# Patient Record
Sex: Female | Born: 1950 | Race: White | Hispanic: No | Marital: Married | Smoking: Never smoker
Health system: Southern US, Community
[De-identification: ages and names within clinical notes are randomized; demographics above are authoritative.]

## PROBLEM LIST (undated history)

## (undated) DIAGNOSIS — M1990 Unspecified osteoarthritis, unspecified site: Secondary | ICD-10-CM

## (undated) DIAGNOSIS — I10 Essential (primary) hypertension: Secondary | ICD-10-CM

## (undated) DIAGNOSIS — Z860100 Personal history of colon polyps, unspecified: Secondary | ICD-10-CM

## (undated) DIAGNOSIS — M810 Age-related osteoporosis without current pathological fracture: Secondary | ICD-10-CM

## (undated) DIAGNOSIS — K589 Irritable bowel syndrome without diarrhea: Secondary | ICD-10-CM

## (undated) HISTORY — PX: RHINOPLASTY: SUR1284

## (undated) HISTORY — PX: OOPHORECTOMY: SHX86

## (undated) HISTORY — DX: Personal history of colonic polyps: Z86.010

## (undated) HISTORY — DX: Essential (primary) hypertension: I10

## (undated) HISTORY — PX: VAGINAL HYSTERECTOMY: SHX2639

## (undated) HISTORY — PX: TOE SURGERY: SHX1073

## (undated) HISTORY — DX: Irritable bowel syndrome, unspecified: K58.9

## (undated) HISTORY — DX: Age-related osteoporosis without current pathological fracture: M81.0

## (undated) HISTORY — DX: Personal history of colon polyps, unspecified: Z86.0100

## (undated) HISTORY — DX: Unspecified osteoarthritis, unspecified site: M19.90

---

## 2015-10-30 DIAGNOSIS — M8580 Other specified disorders of bone density and structure, unspecified site: Secondary | ICD-10-CM | POA: Insufficient documentation

## 2015-10-30 DIAGNOSIS — E559 Vitamin D deficiency, unspecified: Secondary | ICD-10-CM | POA: Insufficient documentation

## 2015-10-30 DIAGNOSIS — K219 Gastro-esophageal reflux disease without esophagitis: Secondary | ICD-10-CM | POA: Insufficient documentation

## 2015-10-30 DIAGNOSIS — E039 Hypothyroidism, unspecified: Secondary | ICD-10-CM

## 2015-10-30 DIAGNOSIS — E7800 Pure hypercholesterolemia, unspecified: Secondary | ICD-10-CM

## 2015-10-30 DIAGNOSIS — M51379 Other intervertebral disc degeneration, lumbosacral region without mention of lumbar back pain or lower extremity pain: Secondary | ICD-10-CM

## 2015-10-30 DIAGNOSIS — M5137 Other intervertebral disc degeneration, lumbosacral region: Secondary | ICD-10-CM

## 2015-10-30 HISTORY — DX: Hypothyroidism, unspecified: E03.9

## 2015-10-30 HISTORY — DX: Other specified disorders of bone density and structure, unspecified site: M85.80

## 2015-10-30 HISTORY — DX: Pure hypercholesterolemia, unspecified: E78.00

## 2015-10-30 HISTORY — DX: Other intervertebral disc degeneration, lumbosacral region: M51.37

## 2015-10-30 HISTORY — DX: Other intervertebral disc degeneration, lumbosacral region without mention of lumbar back pain or lower extremity pain: M51.379

## 2015-10-30 HISTORY — DX: Gastro-esophageal reflux disease without esophagitis: K21.9

## 2015-10-30 HISTORY — DX: Vitamin D deficiency, unspecified: E55.9

## 2015-11-02 DIAGNOSIS — M545 Low back pain, unspecified: Secondary | ICD-10-CM

## 2015-11-02 HISTORY — DX: Low back pain, unspecified: M54.50

## 2016-01-12 DIAGNOSIS — M940 Chondrocostal junction syndrome [Tietze]: Secondary | ICD-10-CM

## 2016-01-12 HISTORY — DX: Chondrocostal junction syndrome (tietze): M94.0

## 2016-01-20 DIAGNOSIS — K5790 Diverticulosis of intestine, part unspecified, without perforation or abscess without bleeding: Secondary | ICD-10-CM | POA: Insufficient documentation

## 2016-01-20 DIAGNOSIS — K5733 Diverticulitis of large intestine without perforation or abscess with bleeding: Secondary | ICD-10-CM | POA: Insufficient documentation

## 2016-01-20 HISTORY — DX: Diverticulitis of large intestine without perforation or abscess with bleeding: K57.33

## 2016-01-20 HISTORY — DX: Diverticulosis of intestine, part unspecified, without perforation or abscess without bleeding: K57.90

## 2016-03-08 HISTORY — PX: COLONOSCOPY: SHX174

## 2016-03-30 DIAGNOSIS — R410 Disorientation, unspecified: Secondary | ICD-10-CM

## 2016-03-30 HISTORY — DX: Disorientation, unspecified: R41.0

## 2016-05-30 DIAGNOSIS — R079 Chest pain, unspecified: Secondary | ICD-10-CM | POA: Insufficient documentation

## 2016-05-30 HISTORY — DX: Chest pain, unspecified: R07.9

## 2016-11-06 DIAGNOSIS — F419 Anxiety disorder, unspecified: Secondary | ICD-10-CM | POA: Insufficient documentation

## 2016-11-06 HISTORY — DX: Anxiety disorder, unspecified: F41.9

## 2017-03-25 DIAGNOSIS — Z862 Personal history of diseases of the blood and blood-forming organs and certain disorders involving the immune mechanism: Secondary | ICD-10-CM

## 2017-03-25 HISTORY — DX: Personal history of diseases of the blood and blood-forming organs and certain disorders involving the immune mechanism: Z86.2

## 2017-06-06 HISTORY — PX: ESOPHAGOGASTRODUODENOSCOPY: SHX1529

## 2018-11-21 DIAGNOSIS — N9489 Other specified conditions associated with female genital organs and menstrual cycle: Secondary | ICD-10-CM | POA: Insufficient documentation

## 2018-11-21 HISTORY — DX: Other specified conditions associated with female genital organs and menstrual cycle: N94.89

## 2020-10-18 DIAGNOSIS — Z5329 Procedure and treatment not carried out because of patient's decision for other reasons: Secondary | ICD-10-CM | POA: Insufficient documentation

## 2020-10-18 DIAGNOSIS — Z5320 Procedure and treatment not carried out because of patient's decision for unspecified reasons: Secondary | ICD-10-CM

## 2020-10-18 HISTORY — DX: Procedure and treatment not carried out because of patient's decision for other reasons: Z53.29

## 2020-10-18 HISTORY — DX: Procedure and treatment not carried out because of patient's decision for unspecified reasons: Z53.20

## 2020-11-12 ENCOUNTER — Other Ambulatory Visit: Payer: Self-pay

## 2020-11-12 ENCOUNTER — Ambulatory Visit (INDEPENDENT_AMBULATORY_CARE_PROVIDER_SITE_OTHER): Payer: Medicare Other | Admitting: Cardiology

## 2020-11-12 VITALS — BP 148/80 | HR 91 | Ht 62.0 in | Wt 177.8 lb

## 2020-11-12 DIAGNOSIS — E669 Obesity, unspecified: Secondary | ICD-10-CM

## 2020-11-12 DIAGNOSIS — I209 Angina pectoris, unspecified: Secondary | ICD-10-CM

## 2020-11-12 DIAGNOSIS — R072 Precordial pain: Secondary | ICD-10-CM

## 2020-11-12 DIAGNOSIS — E7800 Pure hypercholesterolemia, unspecified: Secondary | ICD-10-CM | POA: Diagnosis not present

## 2020-11-12 DIAGNOSIS — E66811 Obesity, class 1: Secondary | ICD-10-CM

## 2020-11-12 DIAGNOSIS — I259 Chronic ischemic heart disease, unspecified: Secondary | ICD-10-CM

## 2020-11-12 HISTORY — DX: Angina pectoris, unspecified: I20.9

## 2020-11-12 HISTORY — DX: Obesity, class 1: E66.811

## 2020-11-12 HISTORY — DX: Obesity, unspecified: E66.9

## 2020-11-12 MED ORDER — METOPROLOL TARTRATE 100 MG PO TABS: 100.0000 mg | ORAL_TABLET | Freq: Once | ORAL | 0 refills | Status: DC

## 2020-11-12 MED ORDER — NITROGLYCERIN 0.4 MG SL SUBL
0.4000 mg | SUBLINGUAL_TABLET | SUBLINGUAL | 6 refills | Status: DC | PRN
Start: 2020-11-12 — End: 2021-02-09

## 2020-11-12 MED ORDER — ASPIRIN EC 81 MG PO TBEC: 81.0000 mg | DELAYED_RELEASE_TABLET | Freq: Every day | ORAL | 3 refills | Status: DC

## 2020-11-12 NOTE — Patient Instructions (Signed)
Medication Instructions:  Your physician has recommended you make the following change in your medication:   Take 81 mg coated aspirin daily. Use nitroglycerin for chest pain.  *If you need a refill on your cardiac medications before your next appointment, please call your pharmacy*   Lab Work: Your physician recommends that you return for lab work in: 1 week before your CT. You do not need an appointment.  If you have labs (blood work) drawn today and your tests are completely normal, you will receive your results only by: Marland Kitchen MyChart Message (if you have MyChart) OR . A paper copy in the mail If you have any lab test that is abnormal or we need to change your treatment, we will call you to review the results.   Testing/Procedures: Your cardiac CT will be scheduled at:   [facility] [place], [place] [address] [phone]   If scheduled at [facility], please arrive at the [facility] main entrance of [facility] 30 minutes prior to test start time. Proceed to the [facility] Radiology Department (first floor) to check-in and test prep.  Please follow these instructions carefully (unless otherwise directed):   On the Night Before the Test: . Be sure to Drink plenty of water. . Do not consume any caffeinated/decaffeinated beverages or chocolate 12 hours prior to your test. . Do not take any antihistamines 12 hours prior to your test.   On the Day of the Test: . Drink plenty of water. Do not drink any water within one hour of the test. . Do not eat any food 4 hours prior to the test. . You may take your regular medications prior to the test.  . Take metoprolol (Lopressor) two hours prior to test. . FEMALES- please wear underwire-free bra if available       After the Test: . Drink plenty of water. . After receiving IV contrast, you may experience a mild flushed feeling. This is normal. . On occasion, you may experience a mild  rash up to 24 hours after the test. This is not dangerous. If this occurs, you can take Benadryl 25 mg and increase your fluid intake. . If you experience trouble breathing, this can be serious. If it is severe call 911 IMMEDIATELY. If it is mild, please call our office.    Once we have confirmed authorization from your insurance company, we will call you to set up a date and time for your test. Based on how quickly your insurance processes prior authorizations requests, please allow up to 4 weeks to be contacted for scheduling your Cardiac CT appointment. Be advised that routine Cardiac CT appointments could be scheduled as many as 8 weeks after your provider has ordered it.  For non-scheduling related questions, please contact the cardiac imaging nurse navigator should you have any questions/concerns: Marchia Bond, Cardiac Imaging Nurse Navigator Burley Saver, Interim Cardiac Imaging Nurse [place] [facility] Direct Office Dial: [phone]   For scheduling needs, including cancellations and rescheduling, please call Vivien Rota at [phone].     Follow-Up: At [facility], you and your health needs are our priority.  As part of our continuing mission to provide you with exceptional heart care, we have created designated Provider Care Teams.  These Care Teams include your primary Cardiologist (physician) and Advanced Practice Providers (APPs -  Physician Assistants and Nurse Practitioners) who all work together to provide you with the care you need, when  you need it.  We recommend signing up for the patient portal called "MyChart".  Sign up information is provided on this After Visit Summary.  MyChart is used to connect with patients for Virtual Visits (Telemedicine).  Patients are able to view lab/test results, encounter notes, upcoming appointments, etc.  Non-urgent messages can be sent to your provider as well.   To learn more about what you can do with MyChart, go  to [email or web address].    Your next appointment:   3 month(s)  The format for your next appointment:   In Person  Provider:   Belva Crome, MD   Other Instructions  Echocardiogram An echocardiogram is a test that uses sound waves (ultrasound) to produce images of the heart. Images from an echocardiogram can provide important information about:  Heart size and shape.  The size and thickness and movement of your heart's walls.  Heart muscle function and strength.  Heart valve function or if you have stenosis. Stenosis is when the heart valves are too narrow.  If blood is flowing backward through the heart valves (regurgitation).  A tumor or infectious growth around the heart valves.  Areas of heart muscle that are not working well because of poor blood flow or injury from a heart attack.  Aneurysm detection. An aneurysm is a weak or damaged part of an artery wall. The wall bulges out from the normal force of blood pumping through the body. Tell a health care provider about:  Any allergies you have.  All medicines you are taking, including vitamins, herbs, eye drops, creams, and over-the-counter medicines.  Any blood disorders you have.  Any surgeries you have had.  Any medical conditions you have.  Whether you are pregnant or may be pregnant. What are the risks? Generally, this is a safe test. However, problems may occur, including an allergic reaction to dye (contrast) that may be used during the test. What happens before the test? No specific preparation is needed. You may eat and drink normally. What happens during the test?  You will take off your clothes from the waist up and put on a hospital gown.  Electrodes or electrocardiogram (ECG)patches may be placed on your chest. The electrodes or patches are then connected to a device that monitors your heart rate and rhythm.  You will lie down on a table for an ultrasound exam. A gel will be applied to  your chest to help sound waves pass through your skin.  A handheld device, called a transducer, will be pressed against your chest and moved over your heart. The transducer produces sound waves that travel to your heart and bounce back (or "echo" back) to the transducer. These sound waves will be captured in real-time and changed into images of your heart that can be viewed on a video monitor. The images will be recorded on a computer and reviewed by your health care provider.  You may be asked to change positions or hold your breath for a short time. This makes it easier to get different views or better views of your heart.  In some cases, you may receive contrast through an IV in one of your veins. This can improve the quality of the pictures from your heart. The procedure may vary among health care providers and hospitals.   What can I expect after the test? You may return to your normal, everyday life, including diet, activities, and medicines, unless your health care provider tells you not to do that. Follow these  instructions at home:  It is up to you to get the results of your test. Ask your health care provider, or the department that is doing the test, when your results will be ready.  Keep all follow-up visits. This is important. Summary  An echocardiogram is a test that uses sound waves (ultrasound) to produce images of the heart.  Images from an echocardiogram can provide important information about the size and shape of your heart, heart muscle function, heart valve function, and other possible heart problems.  You do not need to do anything to prepare before this test. You may eat and drink normally.  After the echocardiogram is completed, you may return to your normal, everyday life, unless your health care provider tells you not to do that. This information is not intended to replace advice given to you by your health care provider. Make sure you discuss any questions you have  with your health care provider. Document Revised: 04/13/2020 Document Reviewed: 04/13/2020 Elsevier Patient Education  2021 [place].  Cardiac CT Angiogram A cardiac CT angiogram is a procedure to look at the heart and the area around the heart. It may be done to help find the cause of chest pains or other symptoms of heart disease. During this procedure, a substance called contrast dye is injected into the blood vessels in the area to be checked. A large X-ray machine, called a CT scanner, then takes detailed pictures of the heart and the surrounding area. The procedure is also sometimes called a coronary CT angiogram, coronary artery scanning, or CTA. A cardiac CT angiogram allows the health care provider to see how well blood is flowing to and from the heart. The health care provider will be able to see if there are any problems, such as:  Blockage or narrowing of the coronary arteries in the heart.  Fluid around the heart.  Signs of weakness or disease in the muscles, valves, and tissues of the heart. Tell a health care provider about:  Any allergies you have. This is especially important if you have had a previous allergic reaction to contrast dye.  All medicines you are taking, including vitamins, herbs, eye drops, creams, and over-the-counter medicines.  Any blood disorders you have.  Any surgeries you have had.  Any medical conditions you have.  Whether you are pregnant or may be pregnant.  Any anxiety disorders, chronic pain, or other conditions you have that may increase your stress or prevent you from lying still. What are the risks? Generally, this is a safe procedure. However, problems may occur, including: 1. Bleeding. 2. Infection. 3. Allergic reactions to medicines or dyes. 4. Damage to other structures or organs. 5. Kidney damage from the contrast dye that is used. 6. Increased risk of cancer from radiation exposure. This risk is low. Talk with your health  care provider about: ? The risks and benefits of testing. ? How you can receive the lowest dose of radiation. What happens before the procedure? 1. Wear comfortable clothing and remove any jewelry, glasses, dentures, and hearing aids. 2. Follow instructions from your health care provider about eating and drinking. This may include: ? For 12 hours before the procedure -- avoid caffeine. This includes tea, coffee, soda, energy drinks, and diet pills. Drink plenty of water or other fluids that do not have caffeine in them. Being well hydrated can prevent complications. ? For 4-6 hours before the procedure -- stop eating and drinking. The contrast dye can cause nausea, but  this is less likely if your stomach is empty. 3. Ask your health care provider about changing or stopping your regular medicines. This is especially important if you are taking diabetes medicines, blood thinners, or medicines to treat problems with erections (erectile dysfunction). What happens during the procedure?  1. Hair on your chest may need to be removed so that small sticky patches called electrodes can be placed on your chest. These will transmit information that helps to monitor your heart during the procedure. 2. An IV will be inserted into one of your veins. 3. You might be given a medicine to control your heart rate during the procedure. This will help to ensure that good images are obtained. 4. You will be asked to lie on an exam table. This table will slide in and out of the CT machine during the procedure. 5. Contrast dye will be injected into the IV. You might feel warm, or you may get a metallic taste in your mouth. 6. You will be given a medicine called nitroglycerin. This will relax or dilate the arteries in your heart. 7. The table that you are lying on will move into the CT machine tunnel for the scan. 8. The person running the machine will give you instructions while the scans are being done. You may be asked  to: ? Keep your arms above your head. ? Hold your breath. ? Stay very still, even if the table is moving. 9. When the scanning is complete, you will be moved out of the machine. 10. The IV will be removed. The procedure may vary among health care providers and hospitals. What can I expect after the procedure? After your procedure, it is common to have:  A metallic taste in your mouth from the contrast dye.  A feeling of warmth.  A headache from the nitroglycerin. Follow these instructions at home:  Take over-the-counter and prescription medicines only as told by your health care provider.  If you are told, drink enough fluid to keep your urine pale yellow. This will help to flush the contrast dye out of your body.  Most people can return to their normal activities right after the procedure. Ask your health care provider what activities are safe for you.  It is up to you to get the results of your procedure. Ask your health care provider, or the department that is doing the procedure, when your results will be ready.  Keep all follow-up visits as told by your health care provider. This is important. Contact a health care provider if: 1. You have any symptoms of allergy to the contrast dye. These include: ? Shortness of breath. ? Rash or hives. ? A racing heartbeat. Summary  A cardiac CT angiogram is a procedure to look at the heart and the area around the heart. It may be done to help find the cause of chest pains or other symptoms of heart disease.  During this procedure, a large X-ray machine, called a CT scanner, takes detailed pictures of the heart and the surrounding area after a contrast dye has been injected into blood vessels in the area.  Ask your health care provider about changing or stopping your regular medicines before the procedure. This is especially important if you are taking diabetes medicines, blood thinners, or medicines to treat erectile dysfunction.  If  you are told, drink enough fluid to keep your urine pale yellow. This will help to flush the contrast dye out of your body. This information is  not intended to replace advice given to you by your health care provider. Make sure you discuss any questions you have with your health care provider. Document Revised: 04/16/2019 Document Reviewed: 04/16/2019 Elsevier Patient Education  [place].  Nitroglycerin sublingual tablets What is this medicine? NITROGLYCERIN (nye troe GLI ser in) is a type of vasodilator. It relaxes blood vessels, increasing the blood and oxygen supply to your heart. This medicine is used to relieve chest pain caused by angina. It is also used to prevent chest pain before activities like climbing stairs, going outdoors in cold weather, or sexual activity. This medicine may be used for other purposes; ask your health care provider or pharmacist if you have questions. COMMON BRAND NAME(S): Nitroquick, Nitrostat, Nitrotab What should I tell my health care provider before I take this medicine? They need to know if you have any of these conditions:  anemia  head injury, recent stroke, or bleeding in the brain  liver disease  previous heart attack  an unusual or allergic reaction to nitroglycerin, other medicines, foods, dyes, or preservatives  pregnant or trying to get pregnant  breast-feeding How should I use this medicine? Take this medicine by mouth as needed. Use at the first sign of an angina attack (chest pain or tightness). You can also take this medicine 5 to 10 minutes before an event likely to produce chest pain. Follow the directions exactly as written on the prescription label. Place one tablet under your tongue and let it dissolve. Do not swallow whole. Replace the dose if you accidentally swallow it. It will help if your mouth is not dry. Saliva around the tablet will help it to dissolve more quickly. Do not eat or drink, smoke or chew tobacco while a  tablet is dissolving. Sit down when taking this medicine. In an angina attack, you should feel better within 5 minutes after your first dose. You can take a dose every 5 minutes up to a total of 3 doses. If you do not feel better or feel worse after 1 dose, call 9-1-1 at once. Do not take more than 3 doses in 15 minutes. Your health care provider might give you other directions. Follow those directions if he or she does. Do not take your medicine more often than directed. Talk to your health care provider about the use of this medicine in children. Special care may be needed. Overdosage: If you think you have taken too much of this medicine contact a poison control center or emergency room at once. NOTE: This medicine is only for you. Do not share this medicine with others. What if I miss a dose? This does not apply. This medicine is only used as needed. What may interact with this medicine? Do not take this medicine with any of the following medications:  certain migraine medicines like ergotamine and dihydroergotamine (DHE)  medicines used to treat erectile dysfunction like sildenafil, tadalafil, and vardenafil  riociguat This medicine may also interact with the following medications:  alteplase  aspirin  heparin  medicines for high blood pressure  medicines for mental depression  other medicines used to treat angina  phenothiazines like chlorpromazine, mesoridazine, prochlorperazine, thioridazine This list may not describe all possible interactions. Give your health care provider a list of all the medicines, herbs, non-prescription drugs, or dietary supplements you use. Also tell them if you smoke, drink alcohol, or use illegal drugs. Some items may interact with your medicine. What should I watch for while using this medicine?  Tell your doctor or health care professional if you feel your medicine is no longer working. Keep this medicine with you at all times. Sit or lie down when  you take your medicine to prevent falling if you feel dizzy or faint after using it. Try to remain calm. This will help you to feel better faster. If you feel dizzy, take several deep breaths and lie down with your feet propped up, or bend forward with your head resting between your knees. You may get drowsy or dizzy. Do not drive, use machinery, or do anything that needs mental alertness until you know how this drug affects you. Do not stand or sit up quickly, especially if you are an older patient. This reduces the risk of dizzy or fainting spells. Alcohol can make you more drowsy and dizzy. Avoid alcoholic drinks. Do not treat yourself for coughs, colds, or pain while you are taking this medicine without asking your doctor or health care professional for advice. Some ingredients may increase your blood pressure. What side effects may I notice from receiving this medicine? Side effects that you should report to your doctor or health care professional as soon as possible:  allergic reactions (skin rash, itching or hives; swelling of the face, lips, or tongue)  low blood pressure (dizziness; feeling faint or lightheaded, falls; unusually weak or tired)  low red blood cell counts (trouble breathing; feeling faint; lightheaded, falls; unusually weak or tired) Side effects that usually do not require medical attention (report to your doctor or health care professional if they continue or are bothersome):  facial flushing (redness)  headache  nausea, vomiting This list may not describe all possible side effects. Call your doctor for medical advice about side effects. You may report side effects to FDA at 1-800-FDA-1088. Where should I keep my medicine? Keep out of the reach of children. Store at room temperature between 20 and 25 degrees C (68 and 77 degrees F). Store in [employer]. Protect from light and moisture. Keep tightly closed. Throw away any unused medicine after the expiration  date. NOTE: This sheet is a summary. It may not cover all possible information. If you have questions about this medicine, talk to your doctor, pharmacist, or health care provider.  2021 Elsevier/Gold Standard (2018-05-22 16:46:32)  Aspirin and Your Heart Aspirin is a medicine that prevents the platelets in your blood from sticking together. Platelets are the cells that your blood uses for clotting. Aspirin can be used to help reduce the risk of blood clots, heart attacks, and other heart-related problems. What are the risks? Daily use of aspirin can cause side effects. Some of these include: Bleeding. Bleeding can be minor or serious. An example of minor bleeding is bleeding from a cut, and the bleeding does not stop. An example of more serious bleeding is stomach bleeding or, rarely, bleeding into the brain. Your risk of bleeding increases if you are also taking NSAIDs, such as ibuprofen. Increased bruising. Upset stomach. An allergic reaction. People who have growths inside the nose (nasal polyps) have an increased risk of developing an aspirin allergy. How to use aspirin to care for your heart Take aspirin only as told by your health care provider. Make sure that you understand how much to take and what form to take. The two forms of aspirin are: Non-enteric-coated.This type of aspirin does not have a coating and is absorbed quickly. This type of aspirin also comes in a chewable form. Enteric-coated. This type of aspirin has  a coating that releases the medicine very slowly. Enteric-coated aspirin might cause less stomach upset than non-enteric-coated aspirin. This type of aspirin should not be chewed or crushed. Work with your health care provider to find out whether it is safe and beneficial for you to take aspirin daily. Taking aspirin daily may be helpful if: You have had a heart attack or chest pain, or you are at risk for a heart attack. You have a condition in which certain heart vessels  are blocked (coronary artery disease), and you have had a procedure to treat it. Examples are: Open-heart surgery, such as coronary artery bypass surgery (CABG). Coronary angioplasty,which is done to widen a blood vessel of your heart. Having a small mesh tube, or stent, placed in your coronary artery. You have had certain types of stroke or a mini-stroke known as a transient ischemic attack (TIA). You have a narrowing of the arteries that supply the limbs (peripheral artery disease, or PAD). You have long-term (chronic) heart rhythm problems, such as atrial fibrillation, and your health care provider thinks aspirin may help. You have valve disease or have had surgery on a valve. You are considered at increased risk of developing coronary artery disease or PAD.   Follow these instructions at home Medicines Take over-the-counter and prescription medicines only as told by your health care provider. If you are taking blood thinners: Talk with your health care provider before you take any medicines that contain aspirin or NSAIDs, such as ibuprofen. These medicines increase your risk for dangerous bleeding. Take your medicine exactly as told, at the same time every day. Avoid activities that could cause injury or bruising, and follow instructions about how to prevent falls. Wear a medical alert bracelet or carry a card that lists what medicines you take. General instructions Do not drink alcohol if: Your health care provider tells you not to drink. You are pregnant, may be pregnant, or are planning to become pregnant. If you drink alcohol: Limit how much you use to: 0-1 drink a day for women. 0-2 drinks a day for men. Be aware of how much alcohol is in your drink. In the [place] one drink equals one 12 oz bottle of beer (355 mL), one 5 oz glass of wine (148 mL), or one 1 oz glass of hard liquor (44 mL). Keep all follow-up visits as told by your health care provider. This is important. Where to  find more information The American Heart Association: www.heart.org Contact a health care provider if you have: Unusual bleeding or bruising. Stomach pain or nausea. Ringing in your ears. An allergic reaction that causes hives, itchy skin, or swelling of the lips, tongue, or face. Get help right away if: You notice that your bowel movements are bloody, or dark red or black in color. You vomit or cough up blood. You have blood in your urine. You cough, breathe loudly (wheeze), or feel short of breath. You have chest pain, especially if the pain spreads to your arms, back, neck, or jaw. You have a headache with confusion. You have any symptoms of a stroke. "BE FAST" is an easy way to remember the main warning signs of a stroke: B - Balance. Signs are dizziness, sudden trouble walking, or loss of balance. E - Eyes. Signs are trouble seeing or a sudden change in vision. F - Face. Signs are sudden weakness or numbness of the face, or the face or eyelid drooping on one side. A - Arms. Signs are weakness or  numbness in an arm. This happens suddenly and usually on one side of the body. S - Speech. Signs are sudden trouble speaking, slurred speech, or trouble understanding what people say. T - Time. Time to call emergency services. Write down what time symptoms started. You have other signs of a stroke, such as: A sudden, severe headache with no known cause. Nausea or vomiting. Seizure. These symptoms may represent a serious problem that is an emergency. Do not wait to see if the symptoms will go away. Get medical help right away. Call your local emergency services (911 in the [place] Do not drive yourself to the hospital. Summary Aspirin use can help reduce the risk of blood clots, heart attacks, and other heart-related problems. Daily use of aspirin can cause side effects. Take aspirin only as told by your health care provider. Make sure that you understand how much to take and what form to  take. Your health care provider will help you determine whether it is safe and beneficial for you to take aspirin daily. This information is not intended to replace advice given to you by your health care provider. Make sure you discuss any questions you have with your health care provider. Document Revised: 05/26/2019 Document Reviewed: 05/26/2019 Elsevier Patient Education  [place].

## 2020-11-12 NOTE — Progress Notes (Signed)
Cardiology Office Note:    Date:  11/12/2020   ID:  Jacqueline Howard, DOB 01-30-1951, MRN [id number]  PCP:  Krystal ClarkBrown-Patram, Melissa Joyce, NP  Cardiologist:  Garwin Brothersajan R Nicolae Vasek, MD   Referring MD: No ref. provider found    ASSESSMENT:    1. Pure hypercholesterolemia   2. Angina pectoris (HCC)   3. Obesity (BMI 30.0-34.9)    PLAN:    In order of problems listed above:  1. Primary prevention stressed with the patient.  Importance of compliance with diet medication stressed and she vocalized understanding. 2. Essential hypertension: Her blood pressure is elevated here.  She denies any history of hypertension.  I think she is anxious and has elevated blood pressure in the office today. 3. Angina pectoris: Her symptoms are concerning and I have made following recommendations to her.  Sublingual nitroglycerin prescription was sent, its protocol and 911 protocol explained and the patient vocalized understanding questions were answered to the patient's satisfaction.  She will take a coated baby aspirin on a daily basis.  I discussed various modalities of evaluation and she prefers coronary CT angiography with FFR.  She knows to go to the nearest emergency room for any significant problems. 4. Mixed dyslipidemia: Very significant and elevated.  I discussed diet with her.  She may need statin therapy in the future based on the above.  Diet was emphasized. 5. Obesity: Weight reduction stressed risks of obesity explained and she vocalized understanding and she promises to do better. 6. Patient will be seen in follow-up appointment in 6 months or earlier if the patient has any concerns    Medication Adjustments/Labs and Tests Ordered: Current medicines are reviewed at length with the patient today.  Concerns regarding medicines are outlined above.  No orders of the defined types were placed in this encounter.  No orders of the defined types were placed in this encounter.    History of Present  Illness:    Jacqueline Howard is a 70 y.o. female who is being seen today for the evaluation of chest pain at the request of primary care physician.  Patient is a pleasant 70 year old female.  She has past medical history of mixed dyslipidemia.  She denies any history of hypertension diabetes mellitus or coronary artery disease.  She mentions to me that she is having chest pain on the left side of her chest going to the upper part of her left arm.  No nausea no vomiting with the symptoms.  They do not occur on exertion.  They occur predominantly at rest and she is very worried about it.  Her LDL is mildly elevated.  She walked on the beach last week without any symptoms.  At the time of my evaluation, the patient is alert awake oriented and in no distress.  Past Medical History:  Diagnosis Date  . Acquired hypothyroidism 10/30/2015   Last Assessment & Plan:  Formatting of this note might be different from the original. Relevant Hx: Course: Daily Update: Today's Plan:updated TSH today on her current dose of med  Electronically signed by: Krystal ClarkMelissa Joyce Brown-Patram, NP 11/02/15 1144 Formatting of this note might be different from the original. Last Assessment & Plan:  Relevant Hx: Course: Daily Update: Today's Plan:updated TSH t  . Adnexal mass 11/21/2018  . Anxiety 11/06/2016  . Chest pain in adult 05/30/2016  . Confusion 03/30/2016   Formatting of this note might be different from the original. Last Assessment & Plan:  She needs to have MRI of her  brain set up and will need CMP and lipids , updated TSH and CBC but will do labs at the hospital when MRI done, set up carotid dopplers as well  . Costochondritis 01/12/2016   Formatting of this note might be different from the original. Last Assessment & Plan:  Relevant Hx: Course: Daily Update: Today's Plan:we discussed this in depth today and she is going to use heat for her discomfort and she is going to avoid the NSAIDS at this time which typically bother her and  suspect her putting out mulch was the problem in the fact she was carrying bales of it bilaterally and   . Degeneration of lumbosacral intervertebral disc 10/30/2015   Last Assessment & Plan:  Formatting of this note might be different from the original. Relevant Hx: Course: Daily Update: Today's Plan:this is stable for her at this time work on diet and exercise  Electronically signed by: Krystal Clark, NP 11/02/15 1145 Formatting of this note might be different from the original. Last Assessment & Plan:  Relevant Hx: Course: Daily Update: Today's P  . Diverticulitis of large intestine without perforation or abscess with bleeding 01/20/2016   Formatting of this note might be different from the original. Last Assessment & Plan:  Relevant Hx: Course: Daily Update: Today's Plan:she is going to have CBC and CMP drawn as well as she is going to have oral abx sent in for this and discussed with her if her S/S worsen or if she is having abnormal labs then we will have her seen by GI sooner than her JUne appt she was able to get herself with D  . Diverticulosis 01/20/2016   Last Assessment & Plan:  Formatting of this note might be different from the original. Relevant Hx: Course: Daily Update: Today's Plan:she is going to have CBC and CMP drawn as well as she is going to have oral abx sent in for this and discussed with her if her S/S worsen or if she is having abnormal labs then we will have her seen by GI sooner than her JUne appt she was able to get herself with D  . Gastroesophageal reflux disease without esophagitis 10/30/2015   Last Assessment & Plan:  Formatting of this note might be different from the original. Relevant Hx: Course: Daily Update: Today's Plan:this is stable for her on the dexilant  Electronically signed by: Krystal Clark, NP 11/02/15 1144 Formatting of this note might be different from the original. Last Assessment & Plan:  Relevant Hx: Course: Daily Update: Today's  Plan:this is stable for   . History of anemia 03/25/2017  . Low back pain without sciatica 11/02/2015   Formatting of this note might be different from the original. Last Assessment & Plan:  Relevant Hx: Course: Daily Update: Today's Plan:update her UA here today to ensure that there is not anything wrong with her intermittent back pain  Electronically signed by: Krystal Clark, NP 11/02/15 1149  . Osteopenia 10/30/2015   Last Assessment & Plan:  Formatting of this note might be different from the original. Relevant Hx: Course: Daily Update: Today's Plan:she is working on taking her calcium and vitamin daily  Electronically signed by: Krystal Clark, NP 11/02/15 1146 Formatting of this note might be different from the original. Last Assessment & Plan:  Relevant Hx: Course: Daily Update: Today's Plan:she  . Pure hypercholesterolemia 10/30/2015   Last Assessment & Plan:  Formatting of this note might be different from the original. Reviewed  with her that her last lipids were elevated and that is weight mediated as she has gained about 30 pounds back that she had lost doing her gluten free diet which she is no longer doing. Formatting of this note might be different from the original. Last Assessment & Plan:  Reviewed with her that her last  . Refusal of statin medication by patient 10/18/2020  . Vitamin D deficiency disease 10/30/2015   Last Assessment & Plan:  Formatting of this note might be different from the original. Relevant Hx: Course: Daily Update: Today's Plan:discussed with her today and she is to have her level checked and she is going to then have her med sent in  Electronically signed by: Krystal Clark, NP 11/02/15 1148 Formatting of this note might be different from the original. Last Assessment & Plan:    Past Surgical History:  Procedure Laterality Date  . OOPHORECTOMY    . RHINOPLASTY    . TOE SURGERY    . VAGINAL HYSTERECTOMY      Current  Medications: Current Meds  Medication Sig  . levothyroxine (SYNTHROID) 88 MCG tablet Take 88 mcg by mouth every morning.  Marland Kitchen LORazepam (ATIVAN) 0.5 MG tablet Take 0.25 mg by mouth every 8 (eight) hours as needed for anxiety.  . Multiple Vitamin (MULTIVITAMIN) tablet Take 1 tablet by mouth daily.  Marland Kitchen omeprazole (PRILOSEC) 40 MG capsule Take 40 mg by mouth as needed (indigestion).  . polycarbophil (FIBERCON) 625 MG tablet Take 625 mg by mouth daily.  . Vitamin D, Ergocalciferol, (DRISDOL) 1.25 MG (50000 UNIT) CAPS capsule Take 50,000 Units by mouth once a week.     Allergies:   Ezetimibe, Nsaids, Pravastatin sodium, and Codeine   Social History   Socioeconomic History  . Marital status: Married    Spouse name: Not on file  . Number of children: Not on file  . Years of education: Not on file  . Highest education level: Not on file  Occupational History  . Not on file  Tobacco Use  . Smoking status: Never Smoker  . Smokeless tobacco: Never Used  Substance and Sexual Activity  . Alcohol use: Not on file  . Drug use: Not on file  . Sexual activity: Not on file  Other Topics Concern  . Not on file  Social History Narrative  . Not on file   Social Determinants of Health   Financial Resource Strain: Not on file  Food Insecurity: Not on file  Transportation Needs: Not on file  Physical Activity: Not on file  Stress: Not on file  Social Connections: Not on file     Family History: The patient's family history includes Hyperlipidemia in her mother; Hypertension in her father and mother.  ROS:   Please see the history of present illness.    All other systems reviewed and are negative.  EKGs/Labs/Other Studies Reviewed:    The following studies were reviewed today: EKG reveals sinus rhythm and nonspecific ST-T changes.   Recent Labs: No results found for requested labs within last 8760 hours.  Recent Lipid Panel No results found for: CHOL, TRIG, HDL, CHOLHDL, VLDL,  LDLCALC, LDLDIRECT  Physical Exam:    VS:  BP (!) 148/80   Pulse 91   Ht 5\' 2"  (1.575 m)   Wt 177 lb 12.8 oz (80.6 kg)   SpO2 95%   BMI 32.52 kg/m     Wt Readings from Last 3 Encounters:  11/12/20 177 lb 12.8 oz (80.6 kg)  GEN: Patient is in no acute distress HEENT: Normal NECK: No JVD; No carotid bruits LYMPHATICS: No lymphadenopathy CARDIAC: S1 S2 regular, 2/6 systolic murmur at the apex. RESPIRATORY:  Clear to auscultation without rales, wheezing or rhonchi  ABDOMEN: Soft, non-tender, non-distended MUSCULOSKELETAL:  No edema; No deformity  SKIN: Warm and dry NEUROLOGIC:  Alert and oriented x 3 PSYCHIATRIC:  Normal affect    Signed, Garwin Brothers, MD  11/12/2020 2:08 PM    [facility]

## 2020-11-16 ENCOUNTER — Other Ambulatory Visit: Payer: Self-pay | Admitting: Specialist

## 2020-11-16 DIAGNOSIS — N644 Mastodynia: Secondary | ICD-10-CM

## 2020-11-26 LAB — BASIC METABOLIC PANEL
BUN/Creatinine Ratio: 9 — ABNORMAL LOW (ref 12–28)
BUN: 9 mg/dL (ref 8–27)
CO2: 22 mmol/L (ref 20–29)
Calcium: 9.3 mg/dL (ref 8.7–10.3)
Chloride: 101 mmol/L (ref 96–106)
Creatinine, Ser: 0.96 mg/dL (ref 0.57–1.00)
Glucose: 114 mg/dL — ABNORMAL HIGH (ref 65–99)
Potassium: 4 mmol/L (ref 3.5–5.2)
Sodium: 141 mmol/L (ref 134–144)
eGFR: 64 mL/min/{1.73_m2} (ref >59)

## 2020-12-01 ENCOUNTER — Other Ambulatory Visit: Payer: Self-pay

## 2020-12-01 ENCOUNTER — Ambulatory Visit
Admission: RE | Admit: 2020-12-01 | Discharge: 2020-12-01 | Disposition: A | Discharge status: Home / Self Care | Payer: Medicare Other | Source: Ambulatory Visit | Attending: Specialist | Admitting: Specialist

## 2020-12-01 DIAGNOSIS — N644 Mastodynia: Secondary | ICD-10-CM

## 2020-12-01 MED ORDER — GADOBUTROL 1 MMOL/ML IV SOLN
8.0000 mL | Freq: Once | INTRAVENOUS | Status: AC | PRN
  Administered 2020-12-01: 8 mL via INTRAVENOUS

## 2020-12-02 ENCOUNTER — Telehealth (HOSPITAL_COMMUNITY): Payer: Self-pay | Admitting: *Deleted

## 2020-12-02 ENCOUNTER — Ambulatory Visit (INDEPENDENT_AMBULATORY_CARE_PROVIDER_SITE_OTHER): Payer: Medicare Other

## 2020-12-02 DIAGNOSIS — I209 Angina pectoris, unspecified: Secondary | ICD-10-CM | POA: Diagnosis not present

## 2020-12-02 DIAGNOSIS — R072 Precordial pain: Secondary | ICD-10-CM

## 2020-12-02 LAB — ECHOCARDIOGRAM COMPLETE
Area-P 1/2: 3.42 cm2
S' Lateral: 2.6 cm

## 2020-12-02 NOTE — Progress Notes (Signed)
Complete echocardiogram performed.  Jimmy Gregary Blackard RDCS, RVT  

## 2020-12-02 NOTE — Telephone Encounter (Signed)
Reaching out to patient to offer assistance regarding upcoming cardiac imaging study; pt verbalizes understanding of appt date/time, parking situation and where to check in, pre-test NPO status and medications ordered, and verified current allergies; name and call back number provided for further questions should they arise  Eviana Sibilia RN Navigator Cardiac Imaging [facility] [phone] office [phone] cell  

## 2020-12-03 ENCOUNTER — Other Ambulatory Visit: Payer: Self-pay

## 2020-12-03 ENCOUNTER — Ambulatory Visit (HOSPITAL_COMMUNITY)
Admission: RE | Admit: 2020-12-03 | Discharge: 2020-12-03 | Disposition: A | Discharge status: Home / Self Care | Payer: Medicare Other | Source: Ambulatory Visit | Attending: Cardiology | Admitting: Cardiology

## 2020-12-03 DIAGNOSIS — I259 Chronic ischemic heart disease, unspecified: Secondary | ICD-10-CM | POA: Diagnosis not present

## 2020-12-03 MED ORDER — NITROGLYCERIN 0.4 MG SL SUBL
0.8000 mg | SUBLINGUAL_TABLET | Freq: Once | SUBLINGUAL | Status: AC
  Administered 2020-12-03: 0.8 mg via SUBLINGUAL

## 2020-12-03 MED ORDER — ROSUVASTATIN CALCIUM 10 MG PO TABS: 10.0000 mg | ORAL_TABLET | Freq: Every day | ORAL | 6 refills | Status: DC

## 2020-12-03 MED ORDER — IOHEXOL 350 MG/ML SOLN
80.0000 mL | Freq: Once | INTRAVENOUS | Status: AC | PRN
  Administered 2020-12-03: 80 mL via INTRAVENOUS

## 2020-12-03 MED ORDER — NITROGLYCERIN 0.4 MG SL SUBL
SUBLINGUAL_TABLET | SUBLINGUAL | Status: AC
  Filled 2020-12-03: qty 2

## 2020-12-03 NOTE — Addendum Note (Signed)
Addended by: Eleonore Chiquito on: 12/03/2020 03:43 PM   Modules accepted: Orders

## 2021-02-14 ENCOUNTER — Ambulatory Visit (INDEPENDENT_AMBULATORY_CARE_PROVIDER_SITE_OTHER): Payer: Medicare Other | Admitting: Cardiology

## 2021-02-14 ENCOUNTER — Encounter: Payer: Self-pay | Admitting: Cardiology

## 2021-02-14 ENCOUNTER — Other Ambulatory Visit: Payer: Self-pay

## 2021-02-14 VITALS — BP 132/80 | HR 70 | Ht 62.0 in | Wt 170.6 lb

## 2021-02-14 DIAGNOSIS — I700 Atherosclerosis of aorta: Secondary | ICD-10-CM

## 2021-02-14 DIAGNOSIS — E7800 Pure hypercholesterolemia, unspecified: Secondary | ICD-10-CM | POA: Diagnosis not present

## 2021-02-14 HISTORY — DX: Atherosclerosis of aorta: I70.0

## 2021-02-14 NOTE — Progress Notes (Signed)
Cardiology Office Note:    Date:  02/14/2021   ID:  Jacqueline Howard, DOB 03-09-1951, MRN [id number]  PCP:  Krystal Clark, NP  Cardiologist:  Garwin Brothers, MD   Referring MD: Rhea Bleacher*    ASSESSMENT:    No diagnosis found. PLAN:    In order of problems listed above:  Primary prevention stressed with the patient.  Importance of compliance with diet and medication stressed and she vocalized understanding. Mixed dyslipidemia: Diet was emphasized.  She wants to do better with diet and exercise.  I wanted to try medication such as Nexletol but she is not keen on it.  She is intolerant to statins and even to Zetia. Aortic atherosclerosis: I discussed my findings with the patient at length and she understands Obesity: Weight reduction was stressed and she promises to do better.  Risks of obesity explained and she understands.  She is trying well and intends to pursue it religiously and meticulously. Patient will be seen in follow-up appointment in 6 months or earlier if the patient has any concerns    Medication Adjustments/Labs and Tests Ordered: Current medicines are reviewed at length with the patient today.  Concerns regarding medicines are outlined above.  No orders of the defined types were placed in this encounter.  No orders of the defined types were placed in this encounter.    No chief complaint on file.    History of Present Illness:    Jacqueline Howard is a 70 y.o. female.  Patient has past medical history of aortic atherosclerosis, mixed dyslipidemia and obesity.  She denies any problems at this time and takes care of activities of daily living.  No chest pain orthopnea or PND.  She tells me that she walks on a regular basis.  She is doing better with diet and exercise and happy about it.  She has lost 6 to 7 pounds since last evaluation.  At the time of my evaluation, the patient is alert awake oriented and in no distress.  Past Medical  History:  Diagnosis Date   Acquired hypothyroidism 10/30/2015   Last Assessment & Plan:  Formatting of this note might be different from the original. Relevant Hx: Course: Daily Update: Today's Plan:updated TSH today on her current dose of med  Electronically signed by: Krystal Clark, NP 11/02/15 1144 Formatting of this note might be different from the original. Last Assessment & Plan:  Relevant Hx: Course: Daily Update: Today's Plan:updated TSH t   Adnexal mass 11/21/2018   Angina pectoris (HCC) 11/12/2020   Anxiety 11/06/2016   Chest pain in adult 05/30/2016   Confusion 03/30/2016   Formatting of this note might be different from the original. Last Assessment & Plan:  She needs to have MRI of her brain set up and will need CMP and lipids , updated TSH and CBC but will do labs at the hospital when MRI done, set up carotid dopplers as well   Costochondritis 01/12/2016   Formatting of this note might be different from the original. Last Assessment & Plan:  Relevant Hx: Course: Daily Update: Today's Plan:we discussed this in depth today and she is going to use heat for her discomfort and she is going to avoid the NSAIDS at this time which typically bother her and suspect her putting out mulch was the problem in the fact she was carrying bales of it bilaterally and    Degeneration of lumbosacral intervertebral disc 10/30/2015   Last Assessment & Plan:  Formatting of this note might be different from the original. Relevant Hx: Course: Daily Update: Today's Plan:this is stable for her at this time work on diet and exercise  Electronically signed by: Krystal Clark, NP 11/02/15 1145 Formatting of this note might be different from the original. Last Assessment & Plan:  Relevant Hx: Course: Daily Update: Today's P   Diverticulitis of large intestine without perforation or abscess with bleeding 01/20/2016   Formatting of this note might be different from the original. Last Assessment & Plan:   Relevant Hx: Course: Daily Update: Today's Plan:she is going to have CBC and CMP drawn as well as she is going to have oral abx sent in for this and discussed with her if her S/S worsen or if she is having abnormal labs then we will have her seen by GI sooner than her JUne appt she was able to get herself with D   Diverticulosis 01/20/2016   Last Assessment & Plan:  Formatting of this note might be different from the original. Relevant Hx: Course: Daily Update: Today's Plan:she is going to have CBC and CMP drawn as well as she is going to have oral abx sent in for this and discussed with her if her S/S worsen or if she is having abnormal labs then we will have her seen by GI sooner than her JUne appt she was able to get herself with D   Gastroesophageal reflux disease without esophagitis 10/30/2015   Last Assessment & Plan:  Formatting of this note might be different from the original. Relevant Hx: Course: Daily Update: Today's Plan:this is stable for her on the dexilant  Electronically signed by: Krystal Clark, NP 11/02/15 1144 Formatting of this note might be different from the original. Last Assessment & Plan:  Relevant Hx: Course: Daily Update: Today's Plan:this is stable for    History of anemia 03/25/2017   Low back pain without sciatica 11/02/2015   Formatting of this note might be different from the original. Last Assessment & Plan:  Relevant Hx: Course: Daily Update: Today's Plan:update her UA here today to ensure that there is not anything wrong with her intermittent back pain  Electronically signed by: Krystal Clark, NP 11/02/15 1149   Obesity (BMI 30.0-34.9) 11/12/2020   Osteopenia 10/30/2015   Last Assessment & Plan:  Formatting of this note might be different from the original. Relevant Hx: Course: Daily Update: Today's Plan:she is working on taking her calcium and vitamin daily  Electronically signed by: Krystal Clark, NP 11/02/15 1146 Formatting of this note  might be different from the original. Last Assessment & Plan:  Relevant Hx: Course: Daily Update: Today's Plan:she   Pure hypercholesterolemia 10/30/2015   Last Assessment & Plan:  Formatting of this note might be different from the original. Reviewed with her that her last lipids were elevated and that is weight mediated as she has gained about 30 pounds back that she had lost doing her gluten free diet which she is no longer doing. Formatting of this note might be different from the original. Last Assessment & Plan:  Reviewed with her that her last   Refusal of statin medication by patient 10/18/2020   Vitamin D deficiency disease 10/30/2015   Last Assessment & Plan:  Formatting of this note might be different from the original. Relevant Hx: Course: Daily Update: Today's Plan:discussed with her today and she is to have her level checked and she is going to then have her med sent  in  Electronically signed by: Krystal Clark, NP 11/02/15 1148 Formatting of this note might be different from the original. Last Assessment & Plan:    Past Surgical History:  Procedure Laterality Date   OOPHORECTOMY     RHINOPLASTY     TOE SURGERY     VAGINAL HYSTERECTOMY      Current Medications: Current Meds  Medication Sig   levothyroxine (SYNTHROID) 88 MCG tablet Take 88 mcg by mouth every morning.   LORazepam (ATIVAN) 0.5 MG tablet Take 0.25 mg by mouth every 8 (eight) hours as needed for anxiety.   Multiple Vitamin (MULTIVITAMIN) tablet Take 1 tablet by mouth daily.   nitroGLYCERIN (NITROSTAT) 0.4 MG SL tablet Place 0.4 mg under the tongue every 5 (five) minutes as needed for chest pain.   omeprazole (PRILOSEC) 40 MG capsule Take 40 mg by mouth as needed for indigestion (indigestion).   polycarbophil (FIBERCON) 625 MG tablet Take 625 mg by mouth daily.   Vitamin D, Ergocalciferol, (DRISDOL) 1.25 MG (50000 UNIT) CAPS capsule Take 50,000 Units by mouth once a week.     Allergies:   Ezetimibe,  Nsaids, Pravastatin sodium, Rosuvastatin, and Codeine   Social History   Socioeconomic History   Marital status: Married    Spouse name: Not on file   Number of children: Not on file   Years of education: Not on file   Highest education level: Not on file  Occupational History   Not on file  Tobacco Use   Smoking status: Never   Smokeless tobacco: Never  Substance and Sexual Activity   Alcohol use: Not on file   Drug use: Not on file   Sexual activity: Not on file  Other Topics Concern   Not on file  Social History Narrative   Not on file   Social Determinants of Health   Financial Resource Strain: Not on file  Food Insecurity: Not on file  Transportation Needs: Not on file  Physical Activity: Not on file  Stress: Not on file  Social Connections: Not on file     Family History: The patient's family history includes Hyperlipidemia in her mother; Hypertension in her father and mother.  ROS:   Please see the history of present illness.    All other systems reviewed and are negative.  EKGs/Labs/Other Studies Reviewed:    The following studies were reviewed today: Calcium scoring revealed a score of 0 and aortic atherosclerosis   Recent Labs: 11/26/2020: BUN 9; Creatinine, Ser 0.96; Potassium 4.0; Sodium 141  Recent Lipid Panel No results found for: CHOL, TRIG, HDL, CHOLHDL, VLDL, LDLCALC, LDLDIRECT  Physical Exam:    VS:  BP 132/80   Pulse 70   Ht 5\' 2"  (1.575 m)   Wt 170 lb 9.6 oz (77.4 kg)   SpO2 95%   BMI 31.20 kg/m     Wt Readings from Last 3 Encounters:  02/14/21 170 lb 9.6 oz (77.4 kg)  11/12/20 177 lb 12.8 oz (80.6 kg)     GEN: Patient is in no acute distress HEENT: Normal NECK: No JVD; No carotid bruits LYMPHATICS: No lymphadenopathy CARDIAC: Hear sounds regular, 2/6 systolic murmur at the apex. RESPIRATORY:  Clear to auscultation without rales, wheezing or rhonchi  ABDOMEN: Soft, non-tender, non-distended MUSCULOSKELETAL:  No edema; No  deformity  SKIN: Warm and dry NEUROLOGIC:  Alert and oriented x 3 PSYCHIATRIC:  Normal affect   Signed, 01/12/21, MD  02/14/2021 1:15 PM    [facility]

## 2021-02-14 NOTE — Patient Instructions (Signed)

## 2021-06-08 ENCOUNTER — Encounter: Payer: Self-pay | Admitting: Gastroenterology

## 2021-07-27 ENCOUNTER — Encounter: Payer: Self-pay | Admitting: Gastroenterology

## 2021-07-27 ENCOUNTER — Ambulatory Visit (INDEPENDENT_AMBULATORY_CARE_PROVIDER_SITE_OTHER): Payer: Medicare Other | Admitting: Gastroenterology

## 2021-07-27 ENCOUNTER — Other Ambulatory Visit: Payer: Self-pay

## 2021-07-27 VITALS — BP 140/90 | HR 80 | Ht 62.0 in | Wt 176.4 lb

## 2021-07-27 DIAGNOSIS — Z86010 Personal history of colonic polyps: Secondary | ICD-10-CM

## 2021-07-27 DIAGNOSIS — R1310 Dysphagia, unspecified: Secondary | ICD-10-CM | POA: Diagnosis not present

## 2021-07-27 DIAGNOSIS — K219 Gastro-esophageal reflux disease without esophagitis: Secondary | ICD-10-CM

## 2021-07-27 MED ORDER — PANTOPRAZOLE SODIUM 40 MG PO TBEC: 40.0000 mg | DELAYED_RELEASE_TABLET | Freq: Every day | ORAL | 6 refills | Status: DC

## 2021-07-27 NOTE — Progress Notes (Signed)
Chief Complaint: For GI evaluation  Referring Provider:  Brown-Patram, [place] AND PLAN;   #1. GERD with occ dysphagia  #2. H/O polyps  Plan: -EGD with dil/colon -Change omeprazole to protonix 40mg  po qd #30, 6 refills    I discussed EGD/Colonoscopy- the indications, risks, alternatives and potential complications including, but not limited to, bleeding, infection, reaction to medication, damage to internal organs, cardiac and/or pulmonary problems, and perforation requiring surgery (1 to 2 in 1000). The possibility that significant findings could be missed was explained. All ? were answered. The patient gives consent to proceed. HPI:    Jacqueline Howard is a 70 y.o. female   C/O solid food intermittent dysphagia x 9 months, mostly with meats and breads, mid chest.  Has associated heartburn despite as needed omeprazole 40 mg.  Wants to change to some other medicines.  No odynophagia.  No weight loss.  No melena.  She is also due for colonoscopy d/t previous history of polyps.  No significant diarrhea or constipation.  No melena or hematochezia. No unintentional weight loss. No abdominal pain.  She has history of IDA (resolved)  due to frequent blood donation.  Most recent hemoglobin was normal at 15 in September 2022   Past GI procedures: Colonoscopy 08/2010: Colon polyps s/p polypectomy, mild sigmoid diverticulosis. EGD 06/2017: -Mild gastritis.  Negative CLO -Gastric polyps s/p polypectomy x 3. Bx; fundic gland polyps -Incidental duodenal diverticulum -Negative small bowel biopsies for celiac Colonoscopy 03/2016 ([facility])-moderate left colonic diverticulosis, internal hemorrhoids.  Otherwise normal to TI.  Repeat in 5 years due to history of advanced polyps.  Past Medical History:  Diagnosis Date   Acquired hypothyroidism 10/30/2015   Last Assessment & Plan:  Formatting of this note might be different from the original. Relevant Hx: Course: Daily  Update: Today's Plan:updated TSH today on her current dose of med  Electronically signed by: Mayer Camel, NP 11/02/15 1144 Formatting of this note might be different from the original. Last Assessment & Plan:  Relevant Hx: Course: Daily Update: Today's Plan:updated TSH t   Adnexal mass 11/21/2018   Angina pectoris ([place]) 11/12/2020   Anxiety 11/06/2016   Chest pain in adult 05/30/2016   Confusion 03/30/2016   Formatting of this note might be different from the original. Last Assessment & Plan:  She needs to have MRI of her brain set up and will need CMP and lipids , updated TSH and CBC but will do labs at the hospital when MRI done, set up carotid dopplers as well   Costochondritis 01/12/2016   Formatting of this note might be different from the original. Last Assessment & Plan:  Relevant Hx: Course: Daily Update: Today's Plan:we discussed this in depth today and she is going to use heat for her discomfort and she is going to avoid the NSAIDS at this time which typically bother her and suspect her putting out mulch was the problem in the fact she was carrying bales of it bilaterally and    Degeneration of lumbosacral intervertebral disc 10/30/2015   Last Assessment & Plan:  Formatting of this note might be different from the original. Relevant Hx: Course: Daily Update: Today's Plan:this is stable for her at this time work on diet and exercise  Electronically signed by: Mayer Camel, NP 11/02/15 1145 Formatting of this note might be different from the original. Last Assessment & Plan:  Relevant Hx: Course: Daily Update: Today's P   Diverticulitis of  large intestine without perforation or abscess with bleeding 01/20/2016   Formatting of this note might be different from the original. Last Assessment & Plan:  Relevant Hx: Course: Daily Update: Today's Plan:she is going to have CBC and CMP drawn as well as she is going to have oral abx sent in for this and discussed with her if  her S/S worsen or if she is having abnormal labs then we will have her seen by GI sooner than her JUne appt she was able to get herself with D   Diverticulosis 01/20/2016   Last Assessment & Plan:  Formatting of this note might be different from the original. Relevant Hx: Course: Daily Update: Today's Plan:she is going to have CBC and CMP drawn as well as she is going to have oral abx sent in for this and discussed with her if her S/S worsen or if she is having abnormal labs then we will have her seen by GI sooner than her JUne appt she was able to get herself with D   Gastroesophageal reflux disease without esophagitis 10/30/2015   Last Assessment & Plan:  Formatting of this note might be different from the original. Relevant Hx: Course: Daily Update: Today's Plan:this is stable for her on the dexilant  Electronically signed by: Mayer Camel, NP 11/02/15 1144 Formatting of this note might be different from the original. Last Assessment & Plan:  Relevant Hx: Course: Daily Update: Today's Plan:this is stable for    History of anemia 03/25/2017   History of colon polyps    IBS (irritable bowel syndrome)    Low back pain without sciatica 11/02/2015   Formatting of this note might be different from the original. Last Assessment & Plan:  Relevant Hx: Course: Daily Update: Today's Plan:update her UA here today to ensure that there is not anything wrong with her intermittent back pain  Electronically signed by: Mayer Camel, NP 11/02/15 1149   Obesity (BMI 30.0-34.9) 11/12/2020   Osteopenia 10/30/2015   Last Assessment & Plan:  Formatting of this note might be different from the original. Relevant Hx: Course: Daily Update: Today's Plan:she is working on taking her calcium and vitamin daily  Electronically signed by: Mayer Camel, NP 11/02/15 1146 Formatting of this note might be different from the original. Last Assessment & Plan:  Relevant Hx: Course: Daily Update:  Today's Plan:she   Pure hypercholesterolemia 10/30/2015   Last Assessment & Plan:  Formatting of this note might be different from the original. Reviewed with her that her last lipids were elevated and that is weight mediated as she has gained about 30 pounds back that she had lost doing her gluten free diet which she is no longer doing. Formatting of this note might be different from the original. Last Assessment & Plan:  Reviewed with her that her last   Refusal of statin medication by patient 10/18/2020   Vitamin D deficiency disease 10/30/2015   Last Assessment & Plan:  Formatting of this note might be different from the original. Relevant Hx: Course: Daily Update: Today's Plan:discussed with her today and she is to have her level checked and she is going to then have her med sent in  Electronically signed by: Mayer Camel, NP 11/02/15 1148 Formatting of this note might be different from the original. Last Assessment & Plan:    Past Surgical History:  Procedure Laterality Date   COLONOSCOPY  03/08/2016   Moderate predominantly left colonic diverticulosis. Internal hemorrhoids (liely etiology  of rectal bleeding-no active bleeding) Otherwise normal colonoscopy to terminal ileum   ESOPHAGOGASTRODUODENOSCOPY  06/06/2017   Mild gastritis. Gastric polyps (status post polypectomy x3). Incidental duodenal diverticulum   OOPHORECTOMY     RHINOPLASTY     TOE SURGERY     VAGINAL HYSTERECTOMY      Family History  Problem Relation Age of Onset   Hypertension Mother    Hyperlipidemia Mother    Hypertension Father    Stomach cancer Paternal Grandmother    Colon cancer Cousin    Rectal cancer Neg Hx     Social History   Tobacco Use   Smoking status: Never   Smokeless tobacco: Never  Vaping Use   Vaping Use: Never used  Substance Use Topics   Alcohol use: Yes    Comment: occ   Drug use: Never    Current Outpatient Medications  Medication Sig Dispense Refill    levothyroxine (SYNTHROID) 88 MCG tablet Take 88 mcg by mouth every morning.     LORazepam (ATIVAN) 0.5 MG tablet Take 0.25 mg by mouth every 8 (eight) hours as needed for anxiety.     Multiple Vitamin (MULTIVITAMIN) tablet Take 1 tablet by mouth daily.     nitroGLYCERIN (NITROSTAT) 0.4 MG SL tablet Place 0.4 mg under the tongue every 5 (five) minutes as needed for chest pain.     omeprazole (PRILOSEC) 40 MG capsule Take 40 mg by mouth as needed for indigestion (indigestion).     polycarbophil (FIBERCON) 625 MG tablet Take 625 mg by mouth daily.     Vitamin D, Ergocalciferol, (DRISDOL) 1.25 MG (50000 UNIT) CAPS capsule Take 50,000 Units by mouth once a week.     No current facility-administered medications for this visit.    Allergies  Allergen Reactions   Ezetimibe     Other reaction(s): Myalgias (intolerance) Joint stiffness   Nsaids Nausea And Vomiting   Pravastatin Sodium Nausea And Vomiting   Rosuvastatin Other (See Comments)    myalgias   Codeine Nausea And Vomiting and Nausea Only    Review of Systems:  Constitutional: Denies fever, chills, diaphoresis, appetite change and fatigue.  HEENT: Denies photophobia, eye pain, redness, hearing loss, ear pain, congestion, sore throat, rhinorrhea, sneezing, mouth sores, neck pain, neck stiffness and tinnitus.   Respiratory: Denies SOB, DOE, cough, chest tightness,  and wheezing.   Cardiovascular: Denies chest pain, palpitations and leg swelling.  Genitourinary: Denies dysuria, urgency, frequency, hematuria, flank pain and difficulty urinating.  Musculoskeletal: Denies myalgias, back pain, joint swelling, arthralgias and gait problem.  Skin: No rash.  Neurological: Denies dizziness, seizures, syncope, weakness, light-headedness, numbness and headaches.  Hematological: Denies adenopathy. Easy bruising, personal or family bleeding history  Psychiatric/Behavioral: Has anxiety or depression     Physical Exam:    BP 140/90   Pulse 80    Ht 5\' 2"  (1.575 m)   Wt 176 lb 6 oz (80 kg)   SpO2 99%   BMI 32.26 kg/m  Wt Readings from Last 3 Encounters:  07/27/21 176 lb 6 oz (80 kg)  02/14/21 170 lb 9.6 oz (77.4 kg)  11/12/20 177 lb 12.8 oz (80.6 kg)   Constitutional:  Well-developed, in no acute distress. Psychiatric: Normal mood and affect. Behavior is normal. HEENT: Pupils normal.  Conjunctivae are normal. No scleral icterus. Neck supple.  Cardiovascular: Normal rate, regular rhythm. No edema Pulmonary/chest: Effort normal and breath sounds normal. No wheezing, rales or rhonchi. Abdominal: Soft, nondistended. Nontender. Bowel sounds active throughout. There are no  masses palpable. No hepatomegaly. Rectal: Deferred Neurological: Alert and oriented to person place and time. Skin: Skin is warm and dry. No rashes noted.  Data Reviewed: I have personally reviewed following labs and imaging studies  CBC: No flowsheet data found.  CMP: CMP Latest Ref Rng & Units 11/26/2020  Glucose 65 - 99 mg/dL 025(E)  BUN 8 - 27 mg/dL 9  Creatinine 5.27 - 7.82 mg/dL 4.23  Sodium 536 - 144 mmol/L 141  Potassium 3.5 - 5.2 mmol/L 4.0  Chloride 96 - 106 mmol/L 101  CO2 20 - 29 mmol/L 22  Calcium 8.7 - 10.3 mg/dL 9.3       Edman Circle, MD 07/27/2021, 10:04 AM  Cc: Brown-Patram, Melissa J*

## 2021-07-27 NOTE — Patient Instructions (Addendum)
If you are age 71 or older, your body mass index should be between 23-30. Your Body mass index is 32.26 kg/m. If this is out of the aforementioned range listed, please consider follow up with your Primary Care Provider.  If you are age 42 or younger, your body mass index should be between 19-25. Your Body mass index is 32.26 kg/m. If this is out of the aformentioned range listed, please consider follow up with your Primary Care Provider.   ________________________________________________________  The [facility] providers would like to encourage you to use [facility] to communicate with providers for non-urgent requests or questions.  Due to long hold times on the telephone, sending your provider a message by [facility] may be a faster and more efficient way to get a response.  Please allow 48 business hours for a response.  Please remember that this is for non-urgent requests.  _______________________________________________________  Bonita Quin have been scheduled for an endoscopy and colonoscopy. Please follow the written instructions given to you at your visit today. Please pick up your prep supplies at the pharmacy within the next 1-3 days. If you use inhalers (even only as needed), please bring them with you on the day of your procedure.  We have sent the following medications to your pharmacy for you to pick up at your convenience: Protonix  Stop omeprazole  We have given you samples of the following medication to take: Clenpiq  Please call with any questions or concerns.  Thank you,  Dr. Lynann Bologna

## 2021-09-29 ENCOUNTER — Encounter: Payer: Self-pay | Admitting: Gastroenterology

## 2021-09-29 ENCOUNTER — Other Ambulatory Visit: Payer: Self-pay

## 2021-09-29 ENCOUNTER — Ambulatory Visit (AMBULATORY_SURGERY_CENTER): Payer: Medicare Other | Admitting: Gastroenterology

## 2021-09-29 VITALS — BP 134/71 | HR 83 | Temp 97.4°F | Resp 10 | Ht 62.0 in | Wt 176.0 lb

## 2021-09-29 DIAGNOSIS — K209 Esophagitis, unspecified without bleeding: Secondary | ICD-10-CM | POA: Diagnosis not present

## 2021-09-29 DIAGNOSIS — K2289 Other specified disease of esophagus: Secondary | ICD-10-CM | POA: Diagnosis not present

## 2021-09-29 DIAGNOSIS — K317 Polyp of stomach and duodenum: Secondary | ICD-10-CM | POA: Diagnosis not present

## 2021-09-29 DIAGNOSIS — Z86010 Personal history of colonic polyps: Secondary | ICD-10-CM | POA: Diagnosis not present

## 2021-09-29 DIAGNOSIS — K5750 Diverticulosis of both small and large intestine without perforation or abscess without bleeding: Secondary | ICD-10-CM | POA: Diagnosis not present

## 2021-09-29 DIAGNOSIS — K2970 Gastritis, unspecified, without bleeding: Secondary | ICD-10-CM

## 2021-09-29 DIAGNOSIS — K219 Gastro-esophageal reflux disease without esophagitis: Secondary | ICD-10-CM

## 2021-09-29 MED ORDER — SODIUM CHLORIDE 0.9 % IV SOLN: 500.0000 mL | Freq: Once | INTRAVENOUS | Status: DC

## 2021-09-29 NOTE — Patient Instructions (Signed)
Impression/Recommendations:  Gastritis, GERD, High fiber diet, Post-dilation diet and diverticulosis handouts given to patient.  Continue present medications. Await pathology results.  Nonpharmacologic means of reflux control.  Repeat colonoscopy not recommended.  Return to GI office as needed.  YOU HAD AN ENDOSCOPIC PROCEDURE TODAY AT THE [facility]:   Refer to the procedure report that was given to you for any specific questions about what was found during the examination.  If the procedure report does not answer your questions, please call your gastroenterologist to clarify.  If you requested that your care partner not be given the details of your procedure findings, then the procedure report has been included in a sealed envelope for you to review at your convenience later.  YOU SHOULD EXPECT: Some feelings of bloating in the abdomen. Passage of more gas than usual.  Walking can help get rid of the air that was put into your GI tract during the procedure and reduce the bloating. If you had a lower endoscopy (such as a colonoscopy or flexible sigmoidoscopy) you may notice spotting of blood in your stool or on the toilet paper. If you underwent a bowel prep for your procedure, you may not have a normal bowel movement for a few days.  Please Note:  You might notice some irritation and congestion in your nose or some drainage.  This is from the oxygen used during your procedure.  There is no need for concern and it should clear up in a day or so.  SYMPTOMS TO REPORT IMMEDIATELY:  Following lower endoscopy (colonoscopy or flexible sigmoidoscopy):  Excessive amounts of blood in the stool  Significant tenderness or worsening of abdominal pains  Swelling of the abdomen that is new, acute  Fever of 100F or higher  Following upper endoscopy (EGD)  Vomiting of blood or coffee ground material  New chest pain or pain under the shoulder blades  Painful or persistently difficult  swallowing  New shortness of breath  Fever of 100F or higher  Black, tarry-looking stools  For urgent or emergent issues, a gastroenterologist can be reached at any hour by calling [phone]. Do not use MyChart messaging for urgent concerns.    DIET:  We do recommend a small meal at first, but then you may proceed to your regular diet.  Drink plenty of fluids but you should avoid alcoholic beverages for 24 hours.  ACTIVITY:  You should plan to take it easy for the rest of today and you should NOT DRIVE or use heavy machinery until tomorrow (because of the sedation medicines used during the test).    FOLLOW UP: Our staff will call the number listed on your records 48-72 hours following your procedure to check on you and address any questions or concerns that you may have regarding the information given to you following your procedure. If we do not reach you, we will leave a message.  We will attempt to reach you two times.  During this call, we will ask if you have developed any symptoms of COVID 19. If you develop any symptoms (ie: fever, flu-like symptoms, shortness of breath, cough etc.) before then, please call [phone].  If you test positive for Covid 19 in the 2 weeks post procedure, please call and report this information to [place].    If any biopsies were taken you will be contacted by phone or by letter within the next 1-3 weeks.  Please call us at [phone] if you have not heard about  the biopsies in 3 weeks.    SIGNATURES/CONFIDENTIALITY: You and/or your care partner have signed paperwork which will be entered into your electronic medical record.  These signatures attest to the fact that that the information above on your After Visit Summary has been reviewed and is understood.  Full responsibility of the confidentiality of this discharge information lies with you and/or your care-partner.

## 2021-09-29 NOTE — Progress Notes (Signed)
Chief Complaint: For GI evaluation  Referring Provider:  Brown-Patram, [place] AND PLAN;   #1. GERD with occ dysphagia  #2. H/O polyps  Plan: -EGD with dil/colon -Change omeprazole to protonix 40mg  po qd #30, 6 refills    I discussed EGD/Colonoscopy- the indications, risks, alternatives and potential complications including, but not limited to, bleeding, infection, reaction to medication, damage to internal organs, cardiac and/or pulmonary problems, and perforation requiring surgery (1 to 2 in 1000). The possibility that significant findings could be missed was explained. All ? were answered. The patient gives consent to proceed. HPI:    Jacqueline Howard is a 71 y.o. female   C/O solid food intermittent dysphagia x 9 months, mostly with meats and breads, mid chest.  Has associated heartburn despite as needed omeprazole 40 mg.  Wants to change to some other medicines.  No odynophagia.  No weight loss.  No melena.  She is also due for colonoscopy d/t previous history of polyps.  No significant diarrhea or constipation.  No melena or hematochezia. No unintentional weight loss. No abdominal pain.  She has history of IDA (resolved)  due to frequent blood donation.  Most recent hemoglobin was normal at 15 in September 2022   Past GI procedures: Colonoscopy 08/2010: Colon polyps s/p polypectomy, mild sigmoid diverticulosis. EGD 06/2017: -Mild gastritis.  Negative CLO -Gastric polyps s/p polypectomy x 3. Bx; fundic gland polyps -Incidental duodenal diverticulum -Negative small bowel biopsies for celiac Colonoscopy 03/2016 ([facility])-moderate left colonic diverticulosis, internal hemorrhoids.  Otherwise normal to TI.  Repeat in 5 years due to history of advanced polyps.  Past Medical History:  Diagnosis Date   Acquired hypothyroidism 10/30/2015   Last Assessment & Plan:  Formatting of this note might be different from the original. Relevant Hx: Course: Daily  Update: Today's Plan:updated TSH today on her current dose of med  Electronically signed by: Mayer Camel, NP 11/02/15 1144 Formatting of this note might be different from the original. Last Assessment & Plan:  Relevant Hx: Course: Daily Update: Today's Plan:updated TSH t   Adnexal mass 11/21/2018   Angina pectoris ([place]) 11/12/2020   Anxiety 11/06/2016   Chest pain in adult 05/30/2016   Confusion 03/30/2016   Formatting of this note might be different from the original. Last Assessment & Plan:  She needs to have MRI of her brain set up and will need CMP and lipids , updated TSH and CBC but will do labs at the hospital when MRI done, set up carotid dopplers as well   Costochondritis 01/12/2016   Formatting of this note might be different from the original. Last Assessment & Plan:  Relevant Hx: Course: Daily Update: Today's Plan:we discussed this in depth today and she is going to use heat for her discomfort and she is going to avoid the NSAIDS at this time which typically bother her and suspect her putting out mulch was the problem in the fact she was carrying bales of it bilaterally and    Degeneration of lumbosacral intervertebral disc 10/30/2015   Last Assessment & Plan:  Formatting of this note might be different from the original. Relevant Hx: Course: Daily Update: Today's Plan:this is stable for her at this time work on diet and exercise  Electronically signed by: Mayer Camel, NP 11/02/15 1145 Formatting of this note might be different from the original. Last Assessment & Plan:  Relevant Hx: Course: Daily Update: Today's P   Diverticulitis of  large intestine without perforation or abscess with bleeding 01/20/2016   Formatting of this note might be different from the original. Last Assessment & Plan:  Relevant Hx: Course: Daily Update: Today's Plan:she is going to have CBC and CMP drawn as well as she is going to have oral abx sent in for this and discussed with her if  her S/S worsen or if she is having abnormal labs then we will have her seen by GI sooner than her JUne appt she was able to get herself with D   Diverticulosis 01/20/2016   Last Assessment & Plan:  Formatting of this note might be different from the original. Relevant Hx: Course: Daily Update: Today's Plan:she is going to have CBC and CMP drawn as well as she is going to have oral abx sent in for this and discussed with her if her S/S worsen or if she is having abnormal labs then we will have her seen by GI sooner than her JUne appt she was able to get herself with D   Gastroesophageal reflux disease without esophagitis 10/30/2015   Last Assessment & Plan:  Formatting of this note might be different from the original. Relevant Hx: Course: Daily Update: Today's Plan:this is stable for her on the dexilant  Electronically signed by: Krystal Clark, NP 11/02/15 1144 Formatting of this note might be different from the original. Last Assessment & Plan:  Relevant Hx: Course: Daily Update: Today's Plan:this is stable for    History of anemia 03/25/2017   History of colon polyps    IBS (irritable bowel syndrome)    Low back pain without sciatica 11/02/2015   Formatting of this note might be different from the original. Last Assessment & Plan:  Relevant Hx: Course: Daily Update: Today's Plan:update her UA here today to ensure that there is not anything wrong with her intermittent back pain  Electronically signed by: Krystal Clark, NP 11/02/15 1149   Obesity (BMI 30.0-34.9) 11/12/2020   Osteopenia 10/30/2015   Last Assessment & Plan:  Formatting of this note might be different from the original. Relevant Hx: Course: Daily Update: Today's Plan:she is working on taking her calcium and vitamin daily  Electronically signed by: Krystal Clark, NP 11/02/15 1146 Formatting of this note might be different from the original. Last Assessment & Plan:  Relevant Hx: Course: Daily Update:  Today's Plan:she   Pure hypercholesterolemia 10/30/2015   Last Assessment & Plan:  Formatting of this note might be different from the original. Reviewed with her that her last lipids were elevated and that is weight mediated as she has gained about 30 pounds back that she had lost doing her gluten free diet which she is no longer doing. Formatting of this note might be different from the original. Last Assessment & Plan:  Reviewed with her that her last   Refusal of statin medication by patient 10/18/2020   Vitamin D deficiency disease 10/30/2015   Last Assessment & Plan:  Formatting of this note might be different from the original. Relevant Hx: Course: Daily Update: Today's Plan:discussed with her today and she is to have her level checked and she is going to then have her med sent in  Electronically signed by: Krystal Clark, NP 11/02/15 1148 Formatting of this note might be different from the original. Last Assessment & Plan:    Past Surgical History:  Procedure Laterality Date   COLONOSCOPY  03/08/2016   Moderate predominantly left colonic diverticulosis. Internal hemorrhoids (liely etiology  of rectal bleeding-no active bleeding) Otherwise normal colonoscopy to terminal ileum   ESOPHAGOGASTRODUODENOSCOPY  06/06/2017   Mild gastritis. Gastric polyps (status post polypectomy x3). Incidental duodenal diverticulum   OOPHORECTOMY     RHINOPLASTY     TOE SURGERY     VAGINAL HYSTERECTOMY      Family History  Problem Relation Age of Onset   Hypertension Mother    Hyperlipidemia Mother    Hypertension Father    Stomach cancer Paternal Grandmother    Colon cancer Cousin    Rectal cancer Neg Hx     Social History   Tobacco Use   Smoking status: Never   Smokeless tobacco: Never  Vaping Use   Vaping Use: Never used  Substance Use Topics   Alcohol use: Yes    Comment: occ   Drug use: Never    Current Outpatient Medications  Medication Sig Dispense Refill    levothyroxine (SYNTHROID) 88 MCG tablet Take 88 mcg by mouth every morning.     LORazepam (ATIVAN) 0.5 MG tablet Take 0.25 mg by mouth every 8 (eight) hours as needed for anxiety.     Multiple Vitamin (MULTIVITAMIN) tablet Take 1 tablet by mouth daily.     omeprazole (PRILOSEC) 40 MG capsule Take 40 mg by mouth as needed for indigestion (indigestion).     polycarbophil (FIBERCON) 625 MG tablet Take 625 mg by mouth daily.     Vitamin D, Ergocalciferol, (DRISDOL) 1.25 MG (50000 UNIT) CAPS capsule Take 50,000 Units by mouth once a week.     nitroGLYCERIN (NITROSTAT) 0.4 MG SL tablet Place 0.4 mg under the tongue every 5 (five) minutes as needed for chest pain.     pantoprazole (PROTONIX) 40 MG tablet Take 1 tablet (40 mg total) by mouth daily. 30 tablet 6   Current Facility-Administered Medications  Medication Dose Route Frequency Provider Last Rate Last Admin   0.9 %  sodium chloride infusion  500 mL Intravenous Once Jackquline Denmark, MD        Allergies  Allergen Reactions   Ezetimibe     Other reaction(s): Myalgias (intolerance) Joint stiffness   Nsaids Nausea And Vomiting   Pravastatin Sodium Nausea And Vomiting   Rosuvastatin Other (See Comments)    myalgias   Codeine Nausea And Vomiting and Nausea Only    Review of Systems:  Constitutional: Denies fever, chills, diaphoresis, appetite change and fatigue.  HEENT: Denies photophobia, eye pain, redness, hearing loss, ear pain, congestion, sore throat, rhinorrhea, sneezing, mouth sores, neck pain, neck stiffness and tinnitus.   Respiratory: Denies SOB, DOE, cough, chest tightness,  and wheezing.   Cardiovascular: Denies chest pain, palpitations and leg swelling.  Genitourinary: Denies dysuria, urgency, frequency, hematuria, flank pain and difficulty urinating.  Musculoskeletal: Denies myalgias, back pain, joint swelling, arthralgias and gait problem.  Skin: No rash.  Neurological: Denies dizziness, seizures, syncope, weakness,  light-headedness, numbness and headaches.  Hematological: Denies adenopathy. Easy bruising, personal or family bleeding history  Psychiatric/Behavioral: Has anxiety or depression     Physical Exam:    BP (!) 145/93    Pulse 95    Temp (!) 97.4 F (36.3 C)    Ht 5\' 2"  (1.575 m)    Wt 176 lb (79.8 kg)    SpO2 96%    BMI 32.19 kg/m  Wt Readings from Last 3 Encounters:  09/29/21 176 lb (79.8 kg)  07/27/21 176 lb 6 oz (80 kg)  02/14/21 170 lb 9.6 oz (77.4 kg)   Constitutional:  Well-developed, in no acute distress. Psychiatric: Normal mood and affect. Behavior is normal. HEENT: Pupils normal.  Conjunctivae are normal. No scleral icterus. Neck supple.  Cardiovascular: Normal rate, regular rhythm. No edema Pulmonary/chest: Effort normal and breath sounds normal. No wheezing, rales or rhonchi. Abdominal: Soft, nondistended. Nontender. Bowel sounds active throughout. There are no masses palpable. No hepatomegaly. Rectal: Deferred Neurological: Alert and oriented to person place and time. Skin: Skin is warm and dry. No rashes noted.  Data Reviewed: I have personally reviewed following labs and imaging studies  CBC: No flowsheet data found.  CMP: CMP Latest Ref Rng & Units 11/26/2020  Glucose 65 - 99 mg/dL 114(H)  BUN 8 - 27 mg/dL 9  Creatinine 0.57 - 1.00 mg/dL 0.96  Sodium 134 - 144 mmol/L 141  Potassium 3.5 - 5.2 mmol/L 4.0  Chloride 96 - 106 mmol/L 101  CO2 20 - 29 mmol/L 22  Calcium 8.7 - 10.3 mg/dL 9.3       Carmell Austria, MD 09/29/2021, 10:24 AM  Cc: Brown-Patram, Melissa J*

## 2021-09-29 NOTE — Progress Notes (Signed)
Pt in recovery with monitors in place, VSS. Report given to receiving RN. Bite guard was placed with pt awake to ensure comfort. No dental or soft tissue damage noted. 

## 2021-09-29 NOTE — Op Note (Signed)
[facility] Patient Name: Jacqueline Howard Procedure Date: 09/29/2021 10:32 AM MRN: [id number] Endoscopist: Lynann Bologna , MD Age: 71 Referring MD:  Date of Birth: 29-Nov-1950 Gender: Female Account #: [id number] Procedure:                Colonoscopy Indications:              High risk colon cancer surveillance: Personal                            history of colonic polyps Medicines:                Monitored Anesthesia Care Procedure:                Pre-Anesthesia Assessment:                           - Prior to the procedure, a History and Physical                            was performed, and patient medications and                            allergies were reviewed. The patient's tolerance of                            previous anesthesia was also reviewed. The risks                            and benefits of the procedure and the sedation                            options and risks were discussed with the patient.                            All questions were answered, and informed consent                            was obtained. Prior Anticoagulants: The patient has                            taken no previous anticoagulant or antiplatelet                            agents. ASA Grade Assessment: II - A patient with                            mild systemic disease. After reviewing the risks                            and benefits, the patient was deemed in                            satisfactory condition to undergo the procedure.  After obtaining informed consent, the colonoscope                            was passed under direct vision. Throughout the                            procedure, the patient's blood pressure, pulse, and                            oxygen saturations were monitored continuously. The                            [id number]-HQ190L Colonoscope was introduced through the                            anus and advanced to the the cecum,  identified by                            appendiceal orifice and ileocecal valve. The                            colonoscopy was performed without difficulty. The                            patient tolerated the procedure well. The quality                            of the bowel preparation was good. The ileocecal                            valve, appendiceal orifice, and rectum were                            photographed. Scope In: 10:48:20 AM Scope Out: 10:58:57 AM Scope Withdrawal Time: 0 hours 6 minutes 54 seconds  Total Procedure Duration: 0 hours 10 minutes 37 seconds  Findings:                 Multiple medium-mouthed diverticula were found in                            the sigmoid colon, descending colon, few in                            ascending colon and one in cecum.                           Non-bleeding internal hemorrhoids were found during                            retroflexion. The hemorrhoids were small and Grade                            I (internal hemorrhoids that do not prolapse).  The exam was otherwise without abnormality on                            direct and retroflexion views. Complications:            No immediate complications. Estimated Blood Loss:     Estimated blood loss: none. Impression:               - Pancolonic diverticulosis predominantly in the                            sigmoid colon.                           - Non-bleeding internal hemorrhoids.                           - The examination was otherwise normal on direct                            and retroflexion views.                           - No specimens collected. Recommendation:           - Patient has a contact number available for                            emergencies. The signs and symptoms of potential                            delayed complications were discussed with the                            patient. Return to normal activities tomorrow.                             Written discharge instructions were provided to the                            patient.                           - High fiber diet.                           - Continue present medications.                           - Repeat colonoscopy is not recommended for                            screening purposes.                           - Return to GI office PRN.                           -  The findings and recommendations were discussed                            with the patient's family. Lynann Bolognaajesh Kathee Tumlin, MD 09/29/2021 11:07:31 AM This report has been signed electronically.

## 2021-09-29 NOTE — Progress Notes (Signed)
VS-CW 

## 2021-09-29 NOTE — Op Note (Signed)
[place] Patient Name: Jacqueline Howard Procedure Date: 09/29/2021 10:32 AM MRN: [id number] Endoscopist: Jackquline Denmark , MD Age: 71 Referring MD:  Date of Birth: 10/18/1950 Gender: Female Account #: [id number] Procedure:                Upper GI endoscopy Indications:              Dysphagia Medicines:                Monitored Anesthesia Care Procedure:                Pre-Anesthesia Assessment:                           - Prior to the procedure, a History and Physical                            was performed, and patient medications and                            allergies were reviewed. The patient's tolerance of                            previous anesthesia was also reviewed. The risks                            and benefits of the procedure and the sedation                            options and risks were discussed with the patient.                            All questions were answered, and informed consent                            was obtained. Prior Anticoagulants: The patient has                            taken no previous anticoagulant or antiplatelet                            agents. ASA Grade Assessment: II - A patient with                            mild systemic disease. After reviewing the risks                            and benefits, the patient was deemed in                            satisfactory condition to undergo the procedure.                           After obtaining informed consent, the endoscope was  passed under direct vision. Throughout the                            procedure, the patient's blood pressure, pulse, and                            oxygen saturations were monitored continuously. The                            GIF [id number] was introduced through the                            mouth, and advanced to the second part of duodenum.                            The upper GI endoscopy was accomplished  without                            difficulty. The patient tolerated the procedure                            well. Scope In: Scope Out: Findings:                 The examined esophagus was mildly tortuous. Z-line                            well-defined at 35 cm, examined by NBI. Biopsies                            were obtained from the proximal and distal                            esophagus with cold forceps for histology of                            suspected eosinophilic esophagitis. The scope was                            withdrawn. Dilation was performed with a Maloney                            dilator with mild resistance at 52 Fr.                           Localized mild inflammation characterized by                            erythema was found in the gastric antrum. Biopsies                            were taken with a cold forceps for histology.                           A few (  10-12) 4 to 8 mm small semi-sessile polyps                            with no bleeding and no stigmata of recent bleeding                            were found in the gastric fundus and in the gastric                            body. Three polyps were removed with a hot snare.                            Resection and retrieval were complete.                           A 10 mm non-bleeding diverticulum was found in the                            second portion of the duodenum.                           The exam was otherwise without abnormality. Complications:            No immediate complications. Estimated Blood Loss:     Estimated blood loss: none. Impression:               - Presbyesophagus s/p esophageal dilatation                           - Gastritis. Biopsied.                           - A few gastric polyps. Resected and retrieved x 3.                           - Non-bleeding duodenal diverticulum. Recommendation:           - Patient has a contact number available for                             emergencies. The signs and symptoms of potential                            delayed complications were discussed with the                            patient. Return to normal activities tomorrow.                            Written discharge instructions were provided to the                            patient.                           - Post dilatation  diet.                           - Continue present medications.                           - Await pathology results.                           - Nonpharmacologic means of reflux control.                            Brochures regarding GERD were given.                           - The findings and recommendations were discussed                            with the patient's family. Jackquline Denmark, MD 09/29/2021 11:04:44 AM This report has been signed electronically.

## 2021-09-29 NOTE — Progress Notes (Signed)
Called to room to assist during endoscopic procedure.  Patient ID and intended procedure confirmed with present staff. Received instructions for my participation in the procedure from the performing physician.  

## 2021-10-03 ENCOUNTER — Telehealth: Payer: Self-pay | Admitting: *Deleted

## 2021-10-03 ENCOUNTER — Telehealth: Payer: Self-pay

## 2021-10-03 NOTE — Telephone Encounter (Signed)
No answer for post procedure call back. Left VM. 

## 2021-10-03 NOTE — Telephone Encounter (Signed)
°  Follow up Call-  Call back number 09/29/2021  Post procedure Call Back phone  # [phone]  Permission to leave phone message Yes     Patient questions:  Do you have a fever, pain , or abdominal swelling? No. Pain Score  0 *  Have you tolerated food without any problems? Yes.    Have you been able to return to your normal activities? Yes.    Do you have any questions about your discharge instructions: Diet   No. Medications  No. Follow up visit  No.  Do you have questions or concerns about your Care? No.  Actions: * If pain score is 4 or above: No action needed, pain <4.

## 2021-10-09 ENCOUNTER — Encounter: Payer: Self-pay | Admitting: Gastroenterology

## 2021-10-17 ENCOUNTER — Telehealth: Payer: Self-pay | Admitting: Gastroenterology

## 2021-10-17 NOTE — Telephone Encounter (Signed)
Patient called today stating she has not received the biopsy results from her recent procedure.  She'd like a call to get verbal results.  Please call patient and advise.  Thank you.

## 2021-10-17 NOTE — Telephone Encounter (Signed)
Pt made aware of results from letter that was created by Dr. Chales Abrahams. Pt verbalized understanding with all questions answered.

## 2021-11-25 ENCOUNTER — Ambulatory Visit: Payer: Medicare Other | Admitting: Cardiology

## 2022-03-12 IMAGING — CT CT HEART MORP W/ CTA COR W/ SCORE W/ CA W/CM &/OR W/O CM
4 of 8 series · 8 of 20 positions shown, 9 images · non-contrast
Comparison: None.
COMPARISON: None.

Addendum:
EXAM:
OVER-READ INTERPRETATION  CT CHEST

The following report is an over-read performed by radiologist Dr.
M Jak Lubas [REDACTED] on 12/03/2020. This over-read
does not include interpretation of cardiac or coronary anatomy or
pathology. The coronary calcium score/coronary CTA interpretation by
the Cardiologist is attached.
CLINICAL DATA: 69F with hypertension, hyperlipidemia and chest
pain.
Cardiac/Coronary  CT
TECHNIQUE: The patient was scanned on a [employer] Force scanner.

[Series 6: best diast 73 % · axial · 0.39mm/px · z∈[-184,-152]mm · 2 of 248 slices shown]
[im 83/248  vessel]
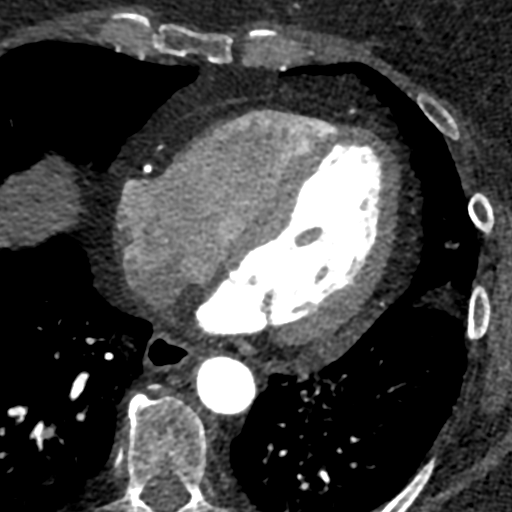
[im 165/248  vessel]
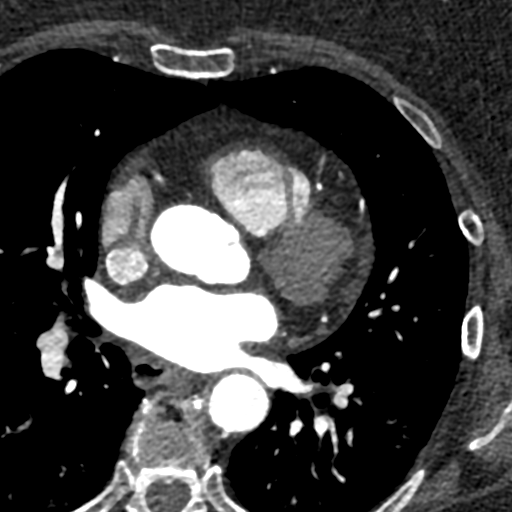

[Series 7: best syst · axial · 0.39mm/px · z∈[-184,-152]mm · 2 of 248 slices shown, 3 images]
[im 83/248  vessel]
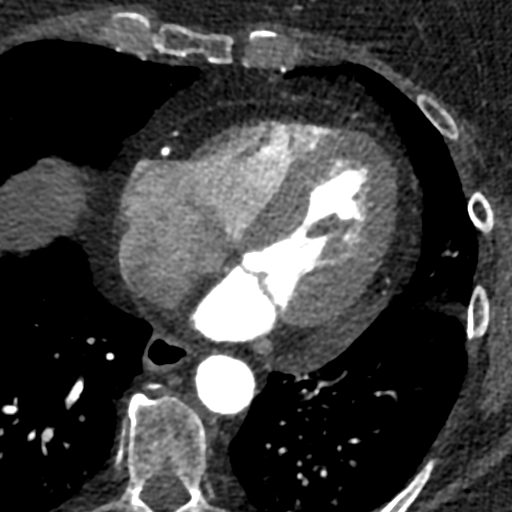
[im 83/248  lung]
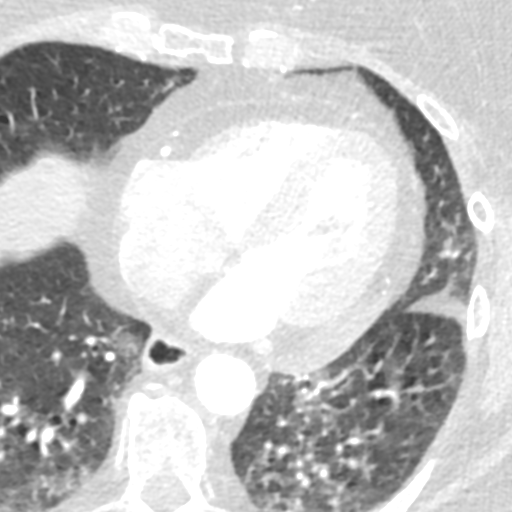
[im 165/248  vessel]
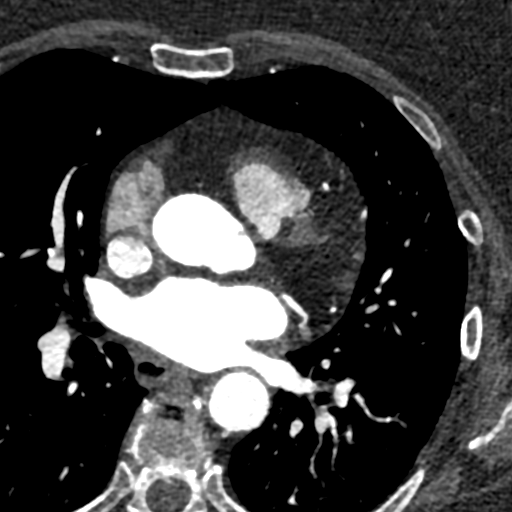

[Series 9: ts diast sharp 73 % · axial · 0.39mm/px · z∈[-184,-152]mm · 2 of 248 slices shown]
[im 83/248  lung]
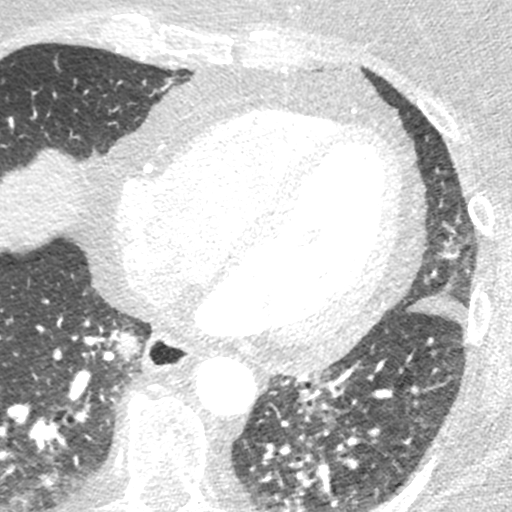
[im 165/248  lung]
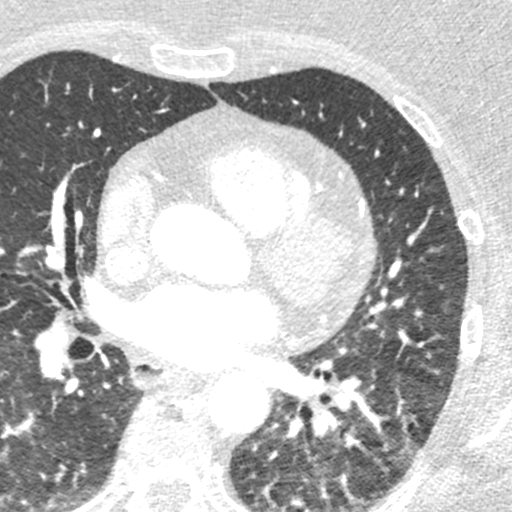

[Series 10: ts syst sharp · axial · 0.39mm/px · z∈[-184,-152]mm · 2 of 248 slices shown]
[im 83/248  lung]
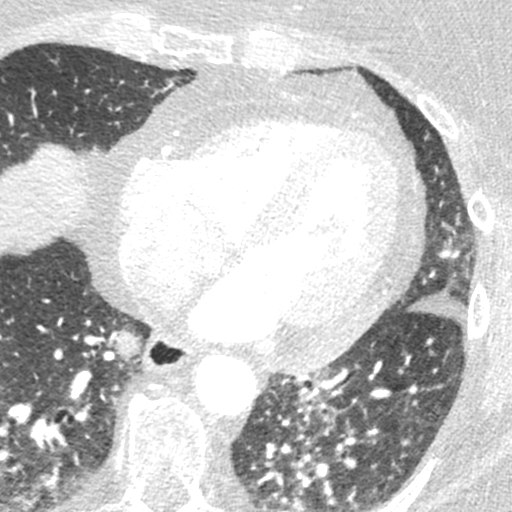
[im 165/248  lung]
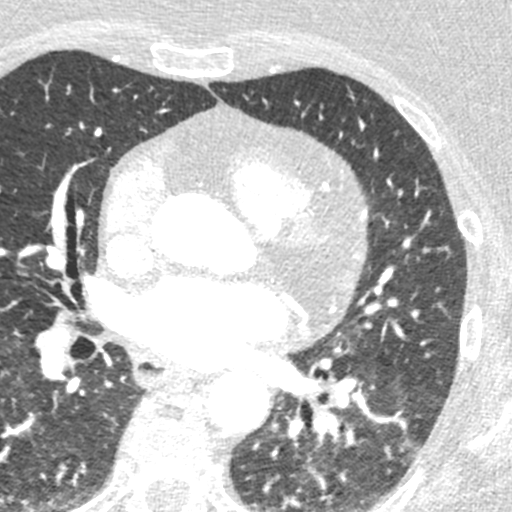

[8 of 20 positions shown; findings below may reference images not displayed]

FINDINGS: Vascular: Scattered mild atherosclerotic calcifications of the
descending thoracic aorta. Normal caliber visualized thoracic aorta
(ascending aorta measures up to 32 mm). No anomalous pulmonary
venous return or evidence of left atrial appendage thrombus. No
evidence of central pulmonary embolism. Normal heart size. No
pericardial effusion.

Mediastinum/Nodes: No enlarged mediastinal or axillary lymph nodes.
The trachea and esophagus demonstrate no significant findings.

Lungs/Pleura: Lungs are clear. No pleural effusion or pneumothorax.

Upper Abdomen: No acute abnormality.

Musculoskeletal: No fracture is seen.
IMPRESSION: 1. No acute intrathoracic abnormality.
2. Mild aortic atherosclerosis (4KSLT-4CM.M).
FINDINGS: A 120 kV prospective scan was triggered in the descending thoracic
aorta at 111 HU's. Axial non-contrast 3 mm slices were carried out
through the heart. The data set was analyzed on a dedicated work
station and scored using the Agatson method. Gantry rotation speed
was 250 msecs and collimation was .6 mm. No beta blockade and 0.8 mg
of sl NTG was given. The 3D data set was reconstructed in 5%
intervals of the 67-82 % of the R-R cycle. Diastolic phases were
analyzed on a dedicated work station using MPR, MIP and VRT modes.
The patient received 80 cc of contrast.

Aorta: Normal size. Mild calcification of the aortic root and
descending aorta. No dissection.

Aortic Valve:  Trileaflet.  No calcifications.

Coronary Arteries:  Normal coronary origin.  Right dominance.

RCA is a large dominant artery that gives rise to PDA and PLVB.
There is no plaque.

Left main is a large artery that gives rise to LAD and LCX arteries.

LAD is a large vessel that has no plaque.

LCX is a non-dominant artery that gives rise to one large OM1
branch. There is no plaque.

Other findings:

Normal pulmonary vein drainage into the left atrium.

Normal let atrial appendage without a thrombus.

Normal size of the pulmonary artery.
IMPRESSION: 1. Coronary calcium score of 0. This was 0 percentile for age and
sex matched control.

2. Normal coronary origin with right dominance.

3. No evidence of CAD.

4.  Minimal atherosclerosis of the aorta.

[employer]

*** End of Addendum ***
EXAM:
OVER-READ INTERPRETATION  CT CHEST

The following report is an over-read performed by radiologist Dr.
M Jak Lubas [REDACTED] on 12/03/2020. This over-read
does not include interpretation of cardiac or coronary anatomy or
pathology. The coronary calcium score/coronary CTA interpretation by
the Cardiologist is attached.
FINDINGS: Vascular: Scattered mild atherosclerotic calcifications of the
descending thoracic aorta. Normal caliber visualized thoracic aorta
(ascending aorta measures up to 32 mm). No anomalous pulmonary
venous return or evidence of left atrial appendage thrombus. No
evidence of central pulmonary embolism. Normal heart size. No
pericardial effusion.

Mediastinum/Nodes: No enlarged mediastinal or axillary lymph nodes.
The trachea and esophagus demonstrate no significant findings.

Lungs/Pleura: Lungs are clear. No pleural effusion or pneumothorax.

Upper Abdomen: No acute abnormality.

Musculoskeletal: No fracture is seen.
IMPRESSION: 1. No acute intrathoracic abnormality.
2. Mild aortic atherosclerosis (4KSLT-4CM.M).

## 2022-03-22 DIAGNOSIS — N644 Mastodynia: Secondary | ICD-10-CM

## 2022-03-22 HISTORY — DX: Mastodynia: N64.4

## 2022-04-03 ENCOUNTER — Other Ambulatory Visit: Payer: Self-pay | Admitting: Gastroenterology

## 2022-04-19 ENCOUNTER — Other Ambulatory Visit: Payer: Self-pay

## 2022-04-19 DIAGNOSIS — K589 Irritable bowel syndrome without diarrhea: Secondary | ICD-10-CM | POA: Insufficient documentation

## 2022-04-19 DIAGNOSIS — Z86010 Personal history of colonic polyps: Secondary | ICD-10-CM | POA: Insufficient documentation

## 2022-04-20 ENCOUNTER — Encounter: Payer: Self-pay | Admitting: Cardiology

## 2022-04-20 ENCOUNTER — Ambulatory Visit (INDEPENDENT_AMBULATORY_CARE_PROVIDER_SITE_OTHER): Payer: Medicare Other | Admitting: Cardiology

## 2022-04-20 VITALS — BP 138/84 | HR 78 | Ht 62.0 in | Wt 163.8 lb

## 2022-04-20 DIAGNOSIS — R079 Chest pain, unspecified: Secondary | ICD-10-CM

## 2022-04-20 DIAGNOSIS — E7800 Pure hypercholesterolemia, unspecified: Secondary | ICD-10-CM | POA: Diagnosis not present

## 2022-04-20 DIAGNOSIS — I700 Atherosclerosis of aorta: Secondary | ICD-10-CM | POA: Diagnosis not present

## 2022-04-20 DIAGNOSIS — Z5320 Procedure and treatment not carried out because of patient's decision for unspecified reasons: Secondary | ICD-10-CM

## 2022-04-20 NOTE — Patient Instructions (Signed)
Medication Instructions:  Your physician recommends that you continue on your current medications as directed. Please refer to the Current Medication list given to you today.  *If you need a refill on your cardiac medications before your next appointment, please call your pharmacy*   Lab Work: None ordered If you have labs (blood work) drawn today and your tests are completely normal, you will receive your results only by: MyChart Message (if you have MyChart) OR A paper copy in the mail If you have any lab test that is abnormal or we need to change your treatment, we will call you to review the results.   Testing/Procedures: Stress Echocardiogram Information Sheet Instructions:  This test will be done at [facility].  Nothing to eat or drink after midnight.  Dress prepared to exercise.  Please bring all current prescription medications.   Follow-Up: At [facility], you and your health needs are our priority.  As part of our continuing mission to provide you with exceptional heart care, we have created designated Provider Care Teams.  These Care Teams include your primary Cardiologist (physician) and Advanced Practice Providers (APPs -  Physician Assistants and Nurse Practitioners) who all work together to provide you with the care you need, when you need it.  We recommend signing up for the patient portal called "MyChart".  Sign up information is provided on this After Visit Summary.  MyChart is used to connect with patients for Virtual Visits (Telemedicine).  Patients are able to view lab/test results, encounter notes, upcoming appointments, etc.  Non-urgent messages can be sent to your provider as well.   To learn more about what you can do with MyChart, go to [email or web address].[email or web address].    Your next appointment:   9 month(s)  The format for your next appointment:   In Person  Provider:   Rajan Revankar, MD   Other Instructions Exercise Stress Echocardiogram An  exercise stress echocardiogram is a test to check how well your heart is working. This test uses sound waves (ultrasound) and a computer to make images of your heart before and after exercise. Ultrasound images that are taken before you exercise (resting echocardiogram) will show how much blood is getting to your heart muscle and how well yourheart muscle and heart valves are functioning. During the next part of this test, you will walk on a treadmill or ride a stationary bicycle to see how exercise affects your heart. While you exercise, the electrical activity of your heart will be monitored with anelectrocardiogram (ECG). Your blood pressure will also be monitored. You may have this test if you have: Chest pain or other symptoms of a heart problem. Recently had a heart attack or heart surgery. Heart valve problems. A condition that causes narrowing of the blood vessels that supply your heart (coronary artery disease). A high risk of heart disease and are starting a new exercise program. A high risk of heart disease and need to have major surgery. Heart arrhythmia (abnormal heart rhythm) problems. Heart failure problems. Tell a health care provider about: Any allergies you have. All medicines you are taking, including vitamins, herbs, eye drops, creams, and over-the-counter medicines. Any problems you or family members have had with anesthetic medicines. Any surgeries you have had. Any blood disorders you have. Any medical conditions you have. Whether you are pregnant or may be pregnant. What are the risks? Generally, this is a safe test. However, problems may occur, including: Chest pain. Dizziness or light-headedness. Shortness of breath. Increased or irregular   heartbeat (palpitations). Nausea or vomiting. Heart attack. This is very rare. What happens before the test? Medicines Ask your health care provider about changing or stopping your regular medicines. This is especially  important if you are taking diabetes medicines or blood thinners. If you use an inhaler, bring it with you to the test. General instructions Wear loose, comfortable clothing and walking shoes. Follow instructions from your health care provider about eating or drinking restrictions. You may be asked to avoid all forms of caffeine for 24 hours before your test, or as told by your health care provider. Do not use any products that contain nicotine or tobacco for 4 hours before the test, or as told by your health care provider. These products include cigarettes, chewing tobacco, and vaping devices, such as e-cigarettes. If you need help quitting, ask your health care provider. What happens during the test?  You will take off your clothes from the waist up and put on a hospital gown. Electrodes or electrocardiogram (ECG) patches may be placed on your chest. The electrodes or patches are then connected to a device that monitors your heart rate and rhythm. A blood pressure cuff will be placed on your arm. You will lie down on a table for an ultrasound exam before you exercise. A gel will be applied to your chest to help sound waves pass through your skin. A handheld device, called a transducer, will be pressed against your chest and moved over your heart. The transducer produces sound waves that travel to your heart and bounce back (or "echo" back) to the transducer. These sound waves will be captured in real-time and changed into images of your heart that can be viewed on a video monitor. The images will be recorded on a computer and reviewed by your health care provider. Once the ultrasound is complete, you will start exercising by walking on a treadmill or pedaling a stationary bicycle. Your blood pressure and heart rhythm will be monitored while you exercise. The exercise will gradually get harder or faster. You will exercise until: Your heart reaches a target level. You are too tired to  continue. You cannot continue because of chest pain, weakness, or dizziness. You will have another ultrasound exam immediately after you stop exercising. The procedure may vary among health care providers and hospitals. What can I expect after the test? After your test, it is common to have: Mild soreness. Mild fatigue. Your heart rate and blood pressure will be monitored until they return to yournormal levels. You should not have any new symptoms after this test. Follow these instructions at home: After your stress test, you should be able to return to your normal activities and diet. Take over-the-counter and prescription medicines only as told by your health care provider. Keep all follow-up visits. This is important. It is up to you to get the results of your test. Ask your health care provider, or the department that is doing the test, when your results will be ready. Contact a health care provider if you: Feel dizzy or light-headed. Have a fast or irregular heartbeat. Have nausea or vomiting. Have a headache. Feel short of breath. Get help right away if you: Develop pain or pressure: In your chest. In your jaw or neck. Between your shoulder blades. Radiating down your left arm. Faint. Have trouble breathing. These symptoms may represent a serious problem that is an emergency. Do not wait to see if the symptoms will go away. Get medical help right away. Call   your local emergency services (911 in the [place] Do not drive yourself to the hospital. Summary An exercise stress echocardiogram is a test that uses ultrasound to check how well your heart works before and after exercise. Before the test, follow instructions from your health care provider about stopping medicines and avoiding caffeine, nicotine and tobacco, and certain foods and drinks. During the test, your blood pressure and heart rhythm will be monitored while you exercise on a treadmill or stationary bicycle. This  information is not intended to replace advice given to you by your health care provider. Make sure you discuss any questions you have with your healthcare provider. Document Revised: 04/13/2020 Document Reviewed: 04/13/2020 Elsevier Patient Education  2022 [employer]vier Inc.  

## 2022-04-20 NOTE — Addendum Note (Signed)
Addended by: Eleonore Chiquito on: 04/20/2022 10:37 AM   Modules accepted: Orders

## 2022-04-20 NOTE — Progress Notes (Signed)
Cardiology Office Note:    Date:  04/20/2022   ID:  Jacqueline Howard, DOB 1950/09/05, MRN [id number]  PCP:  Krystal Clark, NP  Cardiologist:  Garwin Brothers, MD   Referring MD: Rhea Bleacher*    ASSESSMENT:    1. Aortic atherosclerosis (HCC)   2. Refusal of statin medication by patient   3. Pure hypercholesterolemia   4. Chest pain in adult    PLAN:    In order of problems listed above:  Primary prevention stressed with the patient.  Importance of compliance with diet medication stressed and she vocalized understanding. Chest pain: Atypical in nature but she is very concerned about it and we will do a stress echo.  Her calcium score was 0 done about a year ago. Aortic atherosclerosis and mixed dyslipidemia: Lipid lowering suggested.  She is not keen on it.  I discussed risks of not being on lipid-lowering and she understands.  She tells me that she is going to do it with diet and exercise.  She will start exercising once the stress test reports are available to her. Obesity: Weight reduction stressed.  Risks of obesity explained and she is planning to do better. Patient will be seen in follow-up appointment in 6 months or earlier if the patient has any concerns    Medication Adjustments/Labs and Tests Ordered: Current medicines are reviewed at length with the patient today.  Concerns regarding medicines are outlined above.  No orders of the defined types were placed in this encounter.  No orders of the defined types were placed in this encounter.    No chief complaint on file.    History of Present Illness:    Jacqueline Howard is a 71 y.o. female.  Patient has past medical history of aortic atherosclerosis, mixed dyslipidemia, obesity and overall leads a sedentary lifestyle.  She mentions to me that she moved some furniture and has chest pain.  It appears to radiate to the upper left arm.  She does not exercise on a regular basis.  No chest pain with  stress or any such issues and she is concerned and wants to be evaluated.  At the time of my evaluation, the patient is alert awake oriented and in no distress.  Past Medical History:  Diagnosis Date   Acquired hypothyroidism 10/30/2015   Last Assessment & Plan:  Formatting of this note might be different from the original. Relevant Hx: Course: Daily Update: Today's Plan:updated TSH today on her current dose of med  Electronically signed by: Krystal Clark, NP 11/02/15 1144 Formatting of this note might be different from the original. Last Assessment & Plan:  Relevant Hx: Course: Daily Update: Today's Plan:updated TSH t   Adnexal mass 11/21/2018   Angina pectoris (HCC) 11/12/2020   Anxiety 11/06/2016   Aortic atherosclerosis (HCC) 02/14/2021   Breast pain 03/22/2022   Chest pain in adult 05/30/2016   Confusion 03/30/2016   Formatting of this note might be different from the original. Last Assessment & Plan:  She needs to have MRI of her brain set up and will need CMP and lipids , updated TSH and CBC but will do labs at the hospital when MRI done, set up carotid dopplers as well   Costochondritis 01/12/2016   Formatting of this note might be different from the original. Last Assessment & Plan:  Relevant Hx: Course: Daily Update: Today's Plan:we discussed this in depth today and she is going to use heat for her discomfort and she  is going to avoid the NSAIDS at this time which typically bother her and suspect her putting out mulch was the problem in the fact she was carrying bales of it bilaterally and    Degeneration of lumbosacral intervertebral disc 10/30/2015   Last Assessment & Plan:  Formatting of this note might be different from the original. Relevant Hx: Course: Daily Update: Today's Plan:this is stable for her at this time work on diet and exercise  Electronically signed by: Krystal Clark, NP 11/02/15 1145 Formatting of this note might be different from the original.  Last Assessment & Plan:  Relevant Hx: Course: Daily Update: Today's P   Diverticulitis of large intestine without perforation or abscess with bleeding 01/20/2016   Formatting of this note might be different from the original. Last Assessment & Plan:  Relevant Hx: Course: Daily Update: Today's Plan:she is going to have CBC and CMP drawn as well as she is going to have oral abx sent in for this and discussed with her if her S/S worsen or if she is having abnormal labs then we will have her seen by GI sooner than her JUne appt she was able to get herself with D   Diverticulosis 01/20/2016   Last Assessment & Plan:  Formatting of this note might be different from the original. Relevant Hx: Course: Daily Update: Today's Plan:she is going to have CBC and CMP drawn as well as she is going to have oral abx sent in for this and discussed with her if her S/S worsen or if she is having abnormal labs then we will have her seen by GI sooner than her JUne appt she was able to get herself with D   Gastroesophageal reflux disease without esophagitis 10/30/2015   Last Assessment & Plan:  Formatting of this note might be different from the original. Relevant Hx: Course: Daily Update: Today's Plan:this is stable for her on the dexilant  Electronically signed by: Krystal Clark, NP 11/02/15 1144 Formatting of this note might be different from the original. Last Assessment & Plan:  Relevant Hx: Course: Daily Update: Today's Plan:this is stable for    History of anemia 03/25/2017   History of colon polyps    IBS (irritable bowel syndrome)    Low back pain without sciatica 11/02/2015   Formatting of this note might be different from the original. Last Assessment & Plan:  Relevant Hx: Course: Daily Update: Today's Plan:update her UA here today to ensure that there is not anything wrong with her intermittent back pain  Electronically signed by: Krystal Clark, NP 11/02/15 1149   Obesity (BMI 30.0-34.9)  11/12/2020   Osteopenia 10/30/2015   Last Assessment & Plan:  Formatting of this note might be different from the original. Relevant Hx: Course: Daily Update: Today's Plan:she is working on taking her calcium and vitamin daily  Electronically signed by: Krystal Clark, NP 11/02/15 1146 Formatting of this note might be different from the original. Last Assessment & Plan:  Relevant Hx: Course: Daily Update: Today's Plan:she   Pure hypercholesterolemia 10/30/2015   Last Assessment & Plan:  Formatting of this note might be different from the original. Reviewed with her that her last lipids were elevated and that is weight mediated as she has gained about 30 pounds back that she had lost doing her gluten free diet which she is no longer doing. Formatting of this note might be different from the original. Last Assessment & Plan:  Reviewed with her that  her last   Refusal of statin medication by patient 10/18/2020   Vitamin D deficiency disease 10/30/2015   Last Assessment & Plan:  Formatting of this note might be different from the original. Relevant Hx: Course: Daily Update: Today's Plan:discussed with her today and she is to have her level checked and she is going to then have her med sent in  Electronically signed by: Krystal Clark, NP 11/02/15 1148 Formatting of this note might be different from the original. Last Assessment & Plan:    Past Surgical History:  Procedure Laterality Date   COLONOSCOPY  03/08/2016   Moderate predominantly left colonic diverticulosis. Internal hemorrhoids (liely etiology of rectal bleeding-no active bleeding) Otherwise normal colonoscopy to terminal ileum   ESOPHAGOGASTRODUODENOSCOPY  06/06/2017   Mild gastritis. Gastric polyps (status post polypectomy x3). Incidental duodenal diverticulum   OOPHORECTOMY     RHINOPLASTY     TOE SURGERY     VAGINAL HYSTERECTOMY      Current Medications: Current Meds  Medication Sig   levothyroxine  (SYNTHROID) 88 MCG tablet Take 88 mcg by mouth every morning.   LORazepam (ATIVAN) 0.5 MG tablet Take 0.25 mg by mouth every 8 (eight) hours as needed for anxiety.   Multiple Vitamin (MULTIVITAMIN) tablet Take 1 tablet by mouth daily.   nitroGLYCERIN (NITROSTAT) 0.4 MG SL tablet Place 0.4 mg under the tongue every 5 (five) minutes as needed for chest pain.   pantoprazole (PROTONIX) 40 MG tablet TAKE 1 TABLET BY MOUTH EVERY DAY   polycarbophil (FIBERCON) 625 MG tablet Take 625 mg by mouth daily.   sucralfate (CARAFATE) 1 g tablet Take 1 g by mouth in the morning and at bedtime.     Allergies:   Ezetimibe, Nsaids, Pravastatin sodium, Rosuvastatin, and Codeine   Social History   Socioeconomic History   Marital status: Married    Spouse name: Not on file   Number of children: 2   Years of education: Not on file   Highest education level: Not on file  Occupational History   Not on file  Tobacco Use   Smoking status: Never   Smokeless tobacco: Never  Vaping Use   Vaping Use: Never used  Substance and Sexual Activity   Alcohol use: Yes    Comment: occ   Drug use: Never   Sexual activity: Not on file  Other Topics Concern   Not on file  Social History Narrative   Not on file   Social Determinants of Health   Financial Resource Strain: Not on file  Food Insecurity: Not on file  Transportation Needs: Not on file  Physical Activity: Not on file  Stress: Not on file  Social Connections: Not on file     Family History: The patient's family history includes Colon cancer in her cousin; Hyperlipidemia in her mother; Hypertension in her father and mother; Stomach cancer in her paternal grandmother. There is no history of Rectal cancer.  ROS:   Please see the history of present illness.    All other systems reviewed and are negative.  EKGs/Labs/Other Studies Reviewed:    The following studies were reviewed today: EKG reveals sinus rhythm and nonspecific ST-T changes   Recent  Labs: No results found for requested labs within last 365 days.  Recent Lipid Panel No results found for: "CHOL", "TRIG", "HDL", "CHOLHDL", "VLDL", "LDLCALC", "LDLDIRECT"  Physical Exam:    VS:  BP 138/84   Pulse 78   Ht 5\' 2"  (1.575 m)   Wt  163 lb 12.8 oz (74.3 kg)   SpO2 96%   BMI 29.96 kg/m     Wt Readings from Last 3 Encounters:  04/20/22 163 lb 12.8 oz (74.3 kg)  09/29/21 176 lb (79.8 kg)  07/27/21 176 lb 6 oz (80 kg)     GEN: Patient is in no acute distress HEENT: Normal NECK: No JVD; No carotid bruits LYMPHATICS: No lymphadenopathy CARDIAC: Hear sounds regular, 2/6 systolic murmur at the apex. RESPIRATORY:  Clear to auscultation without rales, wheezing or rhonchi  ABDOMEN: Soft, non-tender, non-distended MUSCULOSKELETAL:  No edema; No deformity  SKIN: Warm and dry NEUROLOGIC:  Alert and oriented x 3 PSYCHIATRIC:  Normal affect   Signed, Garwin Brothers, MD  04/20/2022 10:22 AM    [facility]

## 2022-05-01 DIAGNOSIS — R079 Chest pain, unspecified: Secondary | ICD-10-CM | POA: Diagnosis not present

## 2023-04-27 ENCOUNTER — Ambulatory Visit: Payer: Medicare Other | Attending: Cardiology | Admitting: Cardiology

## 2023-04-27 ENCOUNTER — Encounter: Payer: Self-pay | Admitting: Cardiology

## 2023-04-27 VITALS — BP 126/86 | HR 87 | Ht 62.0 in | Wt 174.6 lb

## 2023-04-27 DIAGNOSIS — E669 Obesity, unspecified: Secondary | ICD-10-CM | POA: Insufficient documentation

## 2023-04-27 DIAGNOSIS — Z5320 Procedure and treatment not carried out because of patient's decision for unspecified reasons: Secondary | ICD-10-CM | POA: Diagnosis not present

## 2023-04-27 DIAGNOSIS — I700 Atherosclerosis of aorta: Secondary | ICD-10-CM | POA: Insufficient documentation

## 2023-04-27 DIAGNOSIS — E7800 Pure hypercholesterolemia, unspecified: Secondary | ICD-10-CM | POA: Diagnosis not present

## 2023-04-27 NOTE — Patient Instructions (Signed)

## 2023-04-27 NOTE — Progress Notes (Signed)
Cardiology Office Note:    Date:  04/27/2023   ID:  Jacqueline Howard, DOB 1951-01-27, MRN [id number]  PCP:  Krystal Clark, NP  Cardiologist:  Garwin Brothers, MD   Referring MD: Rhea Bleacher*    ASSESSMENT:    1. Pure hypercholesterolemia   2. Aortic atherosclerosis (HCC)   3. Obesity (BMI 30.0-34.9)   4. Refusal of statin medication by patient    PLAN:    In order of problems listed above:  Primary prevention stressed with the patient.  Importance of compliance with diet medication stressed and patient verbalized standing.  She was advised to walk at least half an hour a day 5 days a week and she promises to do so. Blood pressure stable. Mixed dyslipidemia: Lipids were reviewed from primary care and diet was emphasized.  Goal LDL is less than 60.  She is not keen on any lipid-lowering therapy.  Benefits risks explained and I respect her wishes. Obesity: Weight reduction stressed and diet emphasized and she promises to do better. Patient will be seen in follow-up appointment in 6 months or earlier if the patient has any concerns.     Medication Adjustments/Labs and Tests Ordered: Current medicines are reviewed at length with the patient today.  Concerns regarding medicines are outlined above.  Orders Placed This Encounter  Procedures   EKG 12-Lead   No orders of the defined types were placed in this encounter.    No chief complaint on file.    History of Present Illness:    Jacqueline Howard is a 72 y.o. female.  Patient has past medical history of aortic atherosclerosis, mixed dyslipidemia and obesity.  She denies any problems at this time and takes care of activities of daily living.  Overall she leads a sedentary lifestyle.  At the time of my evaluation, the patient is alert awake oriented and in no distress.  Past Medical History:  Diagnosis Date   Acquired hypothyroidism 10/30/2015   Last Assessment & Plan:  Formatting of this note might  be different from the original. Relevant Hx: Course: Daily Update: Today's Plan:updated TSH today on her current dose of med  Electronically signed by: Krystal Clark, NP 11/02/15 1144 Formatting of this note might be different from the original. Last Assessment & Plan:  Relevant Hx: Course: Daily Update: Today's Plan:updated TSH t   Adnexal mass 11/21/2018   Angina pectoris (HCC) 11/12/2020   Anxiety 11/06/2016   Aortic atherosclerosis (HCC) 02/14/2021   Breast pain 03/22/2022   Chest pain in adult 05/30/2016   Confusion 03/30/2016   Formatting of this note might be different from the original. Last Assessment & Plan:  She needs to have MRI of her brain set up and will need CMP and lipids , updated TSH and CBC but will do labs at the hospital when MRI done, set up carotid dopplers as well   Costochondritis 01/12/2016   Formatting of this note might be different from the original. Last Assessment & Plan:  Relevant Hx: Course: Daily Update: Today's Plan:we discussed this in depth today and she is going to use heat for her discomfort and she is going to avoid the NSAIDS at this time which typically bother her and suspect her putting out mulch was the problem in the fact she was carrying bales of it bilaterally and    Degeneration of lumbosacral intervertebral disc 10/30/2015   Last Assessment & Plan:  Formatting of this note might be different from the original. Relevant  Hx: Course: Daily Update: Today's Plan:this is stable for her at this time work on diet and exercise  Electronically signed by: Krystal Clark, NP 11/02/15 1145 Formatting of this note might be different from the original. Last Assessment & Plan:  Relevant Hx: Course: Daily Update: Today's P   Diverticulitis of large intestine without perforation or abscess with bleeding 01/20/2016   Formatting of this note might be different from the original. Last Assessment & Plan:  Relevant Hx: Course: Daily Update: Today's  Plan:she is going to have CBC and CMP drawn as well as she is going to have oral abx sent in for this and discussed with her if her S/S worsen or if she is having abnormal labs then we will have her seen by GI sooner than her JUne appt she was able to get herself with D   Diverticulosis 01/20/2016   Last Assessment & Plan:  Formatting of this note might be different from the original. Relevant Hx: Course: Daily Update: Today's Plan:she is going to have CBC and CMP drawn as well as she is going to have oral abx sent in for this and discussed with her if her S/S worsen or if she is having abnormal labs then we will have her seen by GI sooner than her JUne appt she was able to get herself with D   Gastroesophageal reflux disease without esophagitis 10/30/2015   Last Assessment & Plan:  Formatting of this note might be different from the original. Relevant Hx: Course: Daily Update: Today's Plan:this is stable for her on the dexilant  Electronically signed by: Krystal Clark, NP 11/02/15 1144 Formatting of this note might be different from the original. Last Assessment & Plan:  Relevant Hx: Course: Daily Update: Today's Plan:this is stable for    History of anemia 03/25/2017   History of colon polyps    IBS (irritable bowel syndrome)    Low back pain without sciatica 11/02/2015   Formatting of this note might be different from the original. Last Assessment & Plan:  Relevant Hx: Course: Daily Update: Today's Plan:update her UA here today to ensure that there is not anything wrong with her intermittent back pain  Electronically signed by: Krystal Clark, NP 11/02/15 1149   Obesity (BMI 30.0-34.9) 11/12/2020   Osteopenia 10/30/2015   Last Assessment & Plan:  Formatting of this note might be different from the original. Relevant Hx: Course: Daily Update: Today's Plan:she is working on taking her calcium and vitamin daily  Electronically signed by: Krystal Clark, NP 11/02/15  1146 Formatting of this note might be different from the original. Last Assessment & Plan:  Relevant Hx: Course: Daily Update: Today's Plan:she   Pure hypercholesterolemia 10/30/2015   Last Assessment & Plan:  Formatting of this note might be different from the original. Reviewed with her that her last lipids were elevated and that is weight mediated as she has gained about 30 pounds back that she had lost doing her gluten free diet which she is no longer doing. Formatting of this note might be different from the original. Last Assessment & Plan:  Reviewed with her that her last   Refusal of statin medication by patient 10/18/2020   Vitamin D deficiency disease 10/30/2015   Last Assessment & Plan:  Formatting of this note might be different from the original. Relevant Hx: Course: Daily Update: Today's Plan:discussed with her today and she is to have her level checked and she is going to then have  her med sent in  Electronically signed by: Krystal Clark, NP 11/02/15 1148 Formatting of this note might be different from the original. Last Assessment & Plan:    Past Surgical History:  Procedure Laterality Date   COLONOSCOPY  03/08/2016   Moderate predominantly left colonic diverticulosis. Internal hemorrhoids (liely etiology of rectal bleeding-no active bleeding) Otherwise normal colonoscopy to terminal ileum   ESOPHAGOGASTRODUODENOSCOPY  06/06/2017   Mild gastritis. Gastric polyps (status post polypectomy x3). Incidental duodenal diverticulum   OOPHORECTOMY     RHINOPLASTY     TOE SURGERY     VAGINAL HYSTERECTOMY      Current Medications: Current Meds  Medication Sig   levothyroxine (SYNTHROID) 88 MCG tablet Take 88 mcg by mouth every morning.   LORazepam (ATIVAN) 0.5 MG tablet Take 0.25 mg by mouth every 8 (eight) hours as needed for anxiety.   Multiple Vitamin (MULTIVITAMIN) tablet Take 1 tablet by mouth daily.   nitroGLYCERIN (NITROSTAT) 0.4 MG SL tablet Place 0.4 mg under the  tongue every 5 (five) minutes as needed for chest pain.   pantoprazole (PROTONIX) 40 MG tablet TAKE 1 TABLET BY MOUTH EVERY DAY   polycarbophil (FIBERCON) 625 MG tablet Take 625 mg by mouth daily.   Vitamin D, Ergocalciferol, (DRISDOL) 1.25 MG (50000 UNIT) CAPS capsule Take 50,000 Units by mouth once a week.     Allergies:   Ezetimibe, Nsaids, Pravastatin sodium, Rosuvastatin, and Codeine   Social History   Socioeconomic History   Marital status: Married    Spouse name: Not on file   Number of children: 2   Years of education: Not on file   Highest education level: Not on file  Occupational History   Not on file  Tobacco Use   Smoking status: Never   Smokeless tobacco: Never  Vaping Use   Vaping status: Never Used  Substance and Sexual Activity   Alcohol use: Yes    Comment: occ   Drug use: Never   Sexual activity: Not on file  Other Topics Concern   Not on file  Social History Narrative   Not on file   Social Determinants of Health   Financial Resource Strain: Not on file  Food Insecurity: Not on file  Transportation Needs: Not on file  Physical Activity: Not on file  Stress: Not on file  Social Connections: Not on file     Family History: The patient's family history includes Colon cancer in her cousin; Hyperlipidemia in her mother; Hypertension in her father and mother; Stomach cancer in her paternal grandmother. There is no history of Rectal cancer.  ROS:   Please see the history of present illness.    All other systems reviewed and are negative.  EKGs/Labs/Other Studies Reviewed:    The following studies were reviewed today: I discussed my findings with the patient at length .Marland KitchenEKG Interpretation Date/Time:  Friday April 27 2023 12:31:20 EDT Ventricular Rate:  87 PR Interval:  164 QRS Duration:  76 QT Interval:  356 QTC Calculation: 428 R Axis:   92  Text Interpretation: Normal sinus rhythm Rightward axis Possible Inferior infarct , age undetermined  Abnormal ECG No previous ECGs available Confirmed by Belva Crome [phone]) on 04/27/2023 12:36:49 PM     Recent Labs: No results found for requested labs within last 365 days.  Recent Lipid Panel No results found for: "CHOL", "TRIG", "HDL", "CHOLHDL", "VLDL", "LDLCALC", "LDLDIRECT"  Physical Exam:    VS:  BP 126/86   Pulse 87  Ht 5\' 2"  (1.575 m)   Wt 174 lb 9.6 oz (79.2 kg)   SpO2 94%   BMI 31.93 kg/m     Wt Readings from Last 3 Encounters:  04/27/23 174 lb 9.6 oz (79.2 kg)  04/20/22 163 lb 12.8 oz (74.3 kg)  09/29/21 176 lb (79.8 kg)     GEN: Patient is in no acute distress HEENT: Normal NECK: No JVD; No carotid bruits LYMPHATICS: No lymphadenopathy CARDIAC: Hear sounds regular, 2/6 systolic murmur at the apex. RESPIRATORY:  Clear to auscultation without rales, wheezing or rhonchi  ABDOMEN: Soft, non-tender, non-distended MUSCULOSKELETAL:  No edema; No deformity  SKIN: Warm and dry NEUROLOGIC:  Alert and oriented x 3 PSYCHIATRIC:  Normal affect   Signed, Garwin Brothers, MD  04/27/2023 12:38 PM    [facility]

## 2023-12-14 ENCOUNTER — Encounter: Payer: Self-pay | Admitting: Physician Assistant

## 2023-12-14 ENCOUNTER — Ambulatory Visit: Admitting: Physician Assistant

## 2023-12-14 ENCOUNTER — Other Ambulatory Visit

## 2023-12-14 VITALS — BP 118/70 | HR 77 | Ht 62.0 in | Wt 177.0 lb

## 2023-12-14 DIAGNOSIS — K5733 Diverticulitis of large intestine without perforation or abscess with bleeding: Secondary | ICD-10-CM

## 2023-12-14 DIAGNOSIS — Z860101 Personal history of adenomatous and serrated colon polyps: Secondary | ICD-10-CM | POA: Diagnosis not present

## 2023-12-14 DIAGNOSIS — K219 Gastro-esophageal reflux disease without esophagitis: Secondary | ICD-10-CM | POA: Diagnosis not present

## 2023-12-14 DIAGNOSIS — M940 Chondrocostal junction syndrome [Tietze]: Secondary | ICD-10-CM

## 2023-12-14 DIAGNOSIS — R1013 Epigastric pain: Secondary | ICD-10-CM

## 2023-12-14 DIAGNOSIS — R1032 Left lower quadrant pain: Secondary | ICD-10-CM

## 2023-12-14 LAB — CBC WITH DIFFERENTIAL/PLATELET
Basophils Absolute: 0.1 10*3/uL (ref 0.0–0.1)
Basophils Relative: 0.9 % (ref 0.0–3.0)
Eosinophils Absolute: 0.1 10*3/uL (ref 0.0–0.7)
Eosinophils Relative: 1.5 % (ref 0.0–5.0)
HCT: 47.5 % — ABNORMAL HIGH (ref 36.0–46.0)
Hemoglobin: 15.8 g/dL — ABNORMAL HIGH (ref 12.0–15.0)
Lymphocytes Relative: 22.2 % (ref 12.0–46.0)
Lymphs Abs: 1.6 10*3/uL (ref 0.7–4.0)
MCHC: 33.2 g/dL (ref 30.0–36.0)
MCV: 96 fl (ref 78.0–100.0)
Monocytes Absolute: 0.7 10*3/uL (ref 0.1–1.0)
Monocytes Relative: 8.9 % (ref 3.0–12.0)
Neutro Abs: 4.9 10*3/uL (ref 1.4–7.7)
Neutrophils Relative %: 66.5 % (ref 43.0–77.0)
Platelets: 249 10*3/uL (ref 150.0–400.0)
RBC: 4.95 Mil/uL (ref 3.87–5.11)
RDW: 13.5 % (ref 11.5–15.5)
WBC: 7.4 10*3/uL (ref 4.0–10.5)

## 2023-12-14 LAB — COMPREHENSIVE METABOLIC PANEL WITH GFR
ALT: 16 U/L (ref 0–35)
AST: 14 U/L (ref 0–37)
Albumin: 4.6 g/dL (ref 3.5–5.2)
Alkaline Phosphatase: 98 U/L (ref 39–117)
BUN: 13 mg/dL (ref 6–23)
CO2: 27 meq/L (ref 19–32)
Calcium: 9.5 mg/dL (ref 8.4–10.5)
Chloride: 104 meq/L (ref 96–112)
Creatinine, Ser: 0.83 mg/dL (ref 0.40–1.20)
GFR: 69.88 mL/min (ref >60.00)
Glucose, Bld: 97 mg/dL (ref 70–99)
Potassium: 3.9 meq/L (ref 3.5–5.1)
Sodium: 140 meq/L (ref 135–145)
Total Bilirubin: 0.5 mg/dL (ref 0.2–1.2)
Total Protein: 7.5 g/dL (ref 6.0–8.3)

## 2023-12-14 LAB — SEDIMENTATION RATE: Sed Rate: 11 mm/h (ref 0–30)

## 2023-12-14 MED ORDER — PANTOPRAZOLE SODIUM 40 MG PO TBEC: 40.0000 mg | DELAYED_RELEASE_TABLET | Freq: Two times a day (BID) | ORAL | 2 refills | Status: DC

## 2023-12-14 NOTE — Progress Notes (Signed)
 12/14/2023 Jacqueline Howard [id number] 1951/01/18  Referring provider: Rhea Bleacher* Primary GI doctor: Dr. Chales Abrahams  ASSESSMENT AND PLAN:   Substernal chest pain x Jan, comes and goes, comes back every year for several years. Had one episode of vomiting, get full very quickly, no weight loss Constant, with burping, no dysphagia, no melena She was on omeprazole this stopped working, started on pantoprazole that also did not help, started on mylanta at night that is helping but still waking up with the pain HIDA negative 03/2022 per patient, unable to see report AB [place] 2023 unable to see report - CT AB and pelvis with contrast to evaluate -increase pantoprazole to BID -  From physical exam and history, likely costochondritis- do lidocaine patches or voltern gel. 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 pylori, fundic polyps Labs reviewed from 09/14/2023 show no anemia normal liver function, Hgb elevated at 15, normal iron and ferritin at 46  History of chest pain 12/2020 CT coronary with coronary calcium score of 0  Patient Care Team: Krystal Clark, NP as PCP - General (Internal Medicine)  HISTORY OF PRESENT ILLNESS: 73 y.o. female with a past medical history listed below presents for evaluation of GERD and abdominal pain.   Patient known to Dr. Chales Abrahams last seen in the office 07/2021 for GERD with occasional dysphagia and history of colon polyps. EGD status post dilation, colonoscopy  diverticulosis and bleeding hemorrhoids, benign biopsies.  Discussed the use of AI scribe software for clinical note transcription with the patient, who gave verbal consent to proceed.  History of Present Illness   Jacqueline Howard "Jacqueline Howard" is a 73 year old female with gastroesophageal reflux disease who presents with persistent abdominal pain and gas.  She experiences persistent abdominal pain and excessive gas. The pain is constant, located in the upper abdomen, and sometimes radiates to the left breast. It is present throughout the day and night, worsening when she wakes up in the middle of the night. She describes the pain as significant and notes it is accompanied by burping and expelling of gas.  She has a history of gastroesophageal reflux disease and underwent an endoscopy in 2023 for trouble swallowing, which showed gastritis and reflux esophagitis. She was previously treated with omeprazole and pantoprazole, but these medications have become ineffective over time. She also tried sucralfate, which caused diarrhea, leading her to discontinue it. Recently, she has been using Mylanta, which provides temporary relief but does not prevent her from waking up in pain.  She reports a single episode of vomiting after eating a cheese sandwich and potato chips, but no recurrent nausea or vomiting. She experiences early satiety and has reduced her food intake, although she has not lost weight. Her bowel movements are daily, light brown in color, and she occasionally misses a day.  Her past medical history includes a colonoscopy in 2023 that showed diverticulosis and hemorrhoids. She has had her heart and breasts checked in the past, with a coronary calcium score of zero in 2022. She is not diabetic and has a history of low blood pressure, which has  slightly increased since the onset of her current symptoms.      She  reports that she has never smoked. She has never used smokeless tobacco. She reports  current alcohol use. She reports that she does not use drugs.  RELEVANT GI HISTORY, IMAGING AND LABS: Results   DIAGNOSTIC Endoscopy: Reflux esophagitis, gastritis, no strictures (2023) Colonoscopy: Diverticulosis, hemorrhoids (2023) Coronary calcium score: 0, no plaque (2022)      CBC No results found for: "WBC", "RBC", "HGB", "HCT", "PLT", "MCV", "MCH", "MCHC", "RDW", "LYMPHSABS", "MONOABS", "EOSABS", "BASOSABS" No results for input(s): "HGB" in the last 8760 hours.  CMP     Component Value Date/Time   NA 141 11/26/2020 1044   K 4.0 11/26/2020 1044   CL 101 11/26/2020 1044   CO2 22 11/26/2020 1044   GLUCOSE 114 (H) 11/26/2020 1044   BUN 9 11/26/2020 1044   CREATININE 0.96 11/26/2020 1044   CALCIUM 9.3 11/26/2020 1044       No data to display            Current Medications:   Current Outpatient Medications (Endocrine & Metabolic):    levothyroxine (SYNTHROID) 88 MCG tablet, Take 88 mcg by mouth every morning.  Current Outpatient Medications (Cardiovascular):    amLODipine (NORVASC) 2.5 MG tablet, Take 2.5 mg by mouth daily.   losartan (COZAAR) 50 MG tablet, Take 50 mg by mouth 2 (two) times daily.   nitroGLYCERIN (NITROSTAT) 0.4 MG SL tablet, Place 0.4 mg under the tongue every 5 (five) minutes as needed for chest pain.     Current Outpatient Medications (Other):    LORazepam (ATIVAN) 0.5 MG tablet, Take 0.25 mg by mouth every 8 (eight) hours as needed for anxiety.   Multiple Vitamin (MULTIVITAMIN) tablet, Take 1 tablet by mouth daily.   polycarbophil (FIBERCON) 625 MG tablet, Take 625 mg by mouth daily.   VITAMIN D PO, Take by mouth. Most days   pantoprazole (PROTONIX) 40 MG tablet, Take 1 tablet (40 mg total) by mouth 2 (two) times daily before a meal.  Medical History:  Past Medical History:  Diagnosis Date   Acquired hypothyroidism 10/30/2015   Last Assessment & Plan:  Formatting of this note might be different from the original. Relevant Hx: Course:  Daily Update: Today's Plan:updated TSH today on her current dose of med  Electronically signed by: Krystal Clark, NP 11/02/15 1144 Formatting of this note might be different from the original. Last Assessment & Plan:  Relevant Hx: Course: Daily Update: Today's Plan:updated TSH t   Adnexal mass 11/21/2018   Angina pectoris (HCC) 11/12/2020   Anxiety 11/06/2016   Aortic atherosclerosis (HCC) 02/14/2021   Breast pain 03/22/2022   Chest pain in adult 05/30/2016   Confusion 03/30/2016   Formatting of this note might be different from the original. Last Assessment & Plan:  She needs to have MRI of her brain set up and will need CMP and lipids , updated TSH and CBC but will do labs at the hospital when MRI done, set up carotid dopplers as well   Costochondritis 01/12/2016   Formatting of this note might be different from the original. Last Assessment & Plan:  Relevant Hx: Course: Daily Update: Today's Plan:we discussed this in depth today and she is going to use heat for her discomfort and she is going to avoid the NSAIDS at this time which typically bother her and suspect her putting out mulch was the problem in the fact she was carrying bales of  it bilaterally and    Degeneration of lumbosacral intervertebral disc 10/30/2015   Last Assessment & Plan:  Formatting of this note might be different from the original. Relevant Hx: Course: Daily Update: Today's Plan:this is stable for her at this time work on diet and exercise  Electronically signed by: Krystal Clark, NP 11/02/15 1145 Formatting of this note might be different from the original. Last Assessment & Plan:  Relevant Hx: Course: Daily Update: Today's P   Diverticulitis of large intestine without perforation or abscess with bleeding 01/20/2016   Formatting of this note might be different from the original. Last Assessment & Plan:  Relevant Hx: Course: Daily Update: Today's Plan:she is going to have CBC and CMP drawn as well as  she is going to have oral abx sent in for this and discussed with her if her S/S worsen or if she is having abnormal labs then we will have her seen by GI sooner than her JUne appt she was able to get herself with D   Diverticulosis 01/20/2016   Last Assessment & Plan:  Formatting of this note might be different from the original. Relevant Hx: Course: Daily Update: Today's Plan:she is going to have CBC and CMP drawn as well as she is going to have oral abx sent in for this and discussed with her if her S/S worsen or if she is having abnormal labs then we will have her seen by GI sooner than her JUne appt she was able to get herself with D   Gastroesophageal reflux disease without esophagitis 10/30/2015   Last Assessment & Plan:  Formatting of this note might be different from the original. Relevant Hx: Course: Daily Update: Today's Plan:this is stable for her on the dexilant  Electronically signed by: Krystal Clark, NP 11/02/15 1144 Formatting of this note might be different from the original. Last Assessment & Plan:  Relevant Hx: Course: Daily Update: Today's Plan:this is stable for    History of anemia 03/25/2017   History of colon polyps    IBS (irritable bowel syndrome)    Low back pain without sciatica 11/02/2015   Formatting of this note might be different from the original. Last Assessment & Plan:  Relevant Hx: Course: Daily Update: Today's Plan:update her UA here today to ensure that there is not anything wrong with her intermittent back pain  Electronically signed by: Krystal Clark, NP 11/02/15 1149   Obesity (BMI 30.0-34.9) 11/12/2020   Osteopenia 10/30/2015   Last Assessment & Plan:  Formatting of this note might be different from the original. Relevant Hx: Course: Daily Update: Today's Plan:she is working on taking her calcium and vitamin daily  Electronically signed by: Krystal Clark, NP 11/02/15 1146 Formatting of this note might be different from the  original. Last Assessment & Plan:  Relevant Hx: Course: Daily Update: Today's Plan:she   Pure hypercholesterolemia 10/30/2015   Last Assessment & Plan:  Formatting of this note might be different from the original. Reviewed with her that her last lipids were elevated and that is weight mediated as she has gained about 30 pounds back that she had lost doing her gluten free diet which she is no longer doing. Formatting of this note might be different from the original. Last Assessment & Plan:  Reviewed with her that her last   Refusal of statin medication by patient 10/18/2020   Vitamin D deficiency disease 10/30/2015   Last Assessment & Plan:  Formatting of this note might  be different from the original. Relevant Hx: Course: Daily Update: Today's Plan:discussed with her today and she is to have her level checked and she is going to then have her med sent in  Electronically signed by: Krystal Clark, NP 11/02/15 1148 Formatting of this note might be different from the original. Last Assessment & Plan:   Allergies:  Allergies  Allergen Reactions   Ezetimibe     Myalgias (intolerance) Joint stiffness   Nsaids Nausea And Vomiting   Pravastatin Sodium Nausea And Vomiting   Rosuvastatin Other (See Comments)    myalgias   Sucralfate Diarrhea   Codeine Nausea And Vomiting and Nausea Only     Surgical History:  She  has a past surgical history that includes Oophorectomy; Vaginal hysterectomy; Rhinoplasty; Toe Surgery; Esophagogastroduodenoscopy (06/06/2017); and Colonoscopy (03/08/2016). Family History:  Her family history includes Colon cancer in her cousin; Hyperlipidemia in her mother; Hypertension in her father and mother; Stomach cancer in her paternal grandmother.  REVIEW OF SYSTEMS  : All other systems reviewed and negative except where noted in the History of Present Illness.  PHYSICAL EXAM: BP 118/70   Pulse 77   Ht 5\' 2"  (1.575 m)   Wt 177 lb (80.3 kg)   SpO2 97%   BMI  32.37 kg/m  Physical Exam   GENERAL APPEARANCE: Well nourished, in no apparent distress. HEENT: No cervical lymphadenopathy, unremarkable thyroid, sclerae anicteric, conjunctiva pink. RESPIRATORY: Respiratory effort normal, BS equal bilateral without rales, rhonchi, wheezing. Tenderness on palpation of chest wall. CARDIO: RRR with no MRGs, peripheral pulses intact. ABDOMEN: Soft, non distended, active bowel sounds in all 4 quadrants, epigastric tenderness on palpation, no rebound, no mass appreciated. RECTAL: Declines. MUSCULOSKELETAL: Full ROM, normal gait, without edema. SKIN: Dry, intact without rashes or lesions. No jaundice. NEURO: Alert, oriented, no focal deficits. PSYCH: Cooperative, normal mood and affect.      Doree Albee, PA-C 3:51 PM

## 2023-12-14 NOTE — Patient Instructions (Signed)
 Your provider has requested that you go to the basement level for lab work before leaving today. Press "B" on the elevator. The lab is located at the first door on the left as you exit the elevator.  You have been scheduled for a CT scan of the abdomen and pelvis at [facility], 1st floor Radiology. You are scheduled on 01/03/2024 at 9:30am. You should arrive 15 minutes prior to your appointment time for registration.    You may take any medications as prescribed with a small amount of water, if necessary. If you take any of the following medications: METFORMIN, GLUCOPHAGE, GLUCOVANCE, AVANDAMET, RIOMET, FORTAMET, ACTOPLUS MET, JANUMET, GLUMETZA or METAGLIP, you MAY be asked to HOLD this medication 48 hours AFTER the exam.   If you have any questions regarding your exam or if you need to reschedule, you may call [facility] at [phone] between the hours of 8:00 am and 5:00 pm, Monday-Friday.   Please take your proton pump inhibitor medication, pantoprazole twice day   Please take this medication 30 minutes to 1 hour before meals- this makes it more effective.  Avoid spicy and acidic foods Avoid fatty foods Limit your intake of coffee, tea, alcohol, and carbonated drinks Work to maintain a healthy weight Keep the head of the bed elevated at least 3 inches with blocks or a wedge pillow if you are having any nighttime symptoms Stay upright for 2 hours after eating Avoid meals and snacks three to four hours before bedtime  No aleve, ibuprofen, goody powders, as these are antiinflammatories and can cause inflammation in your stomach, increase bleeding risk and cause ulcers.  You can talk with PCP about alternative pain options.  Can do tyelnol max 3000 mg a day, salon pas patches are over the counter and voltern gel is topical antiinflammatory that is safe.   Small intestinal bacterial overgrowth (SIBO) occurs when there is an abnormal increase in the overall bacterial  population in the small intestine -- particularly types of bacteria not commonly found in that part of the digestive tract. Small intestinal bacterial overgrowth (SIBO) commonly results when a circumstance -- such as surgery or disease -- slows the passage of food and waste products in the digestive tract, creating a breeding ground for bacteria.  Signs and symptoms of SIBO often include: Loss of appetite Abdominal pain Nausea Bloating An uncomfortable feeling of fullness after eating Diarrhea or constipation, depending on the type of gas produced  What foods trigger SIBO? While foods aren't the original cause of SIBO, certain foods do encourage the overgrowth of the wrong bacteria in your small intestine. If you're feeding them their favorite foods, they're going to grow more, and that will trigger more of your SIBO symptoms. By the same token, you can help reduce the overgrowth by starving the problematic bacteria of their favorite foods. This strategy has led to a number of proposed SIBO eating plans. The plans vary, and so do individual results. But in general, they tend to recommend limiting carbohydrates.  These include: Sugars and sweeteners. Fruits and starchy vegetables. Dairy products. Grains.  There is a test for this we can do called a breath test, if you are positive we will treat you with an antibiotic to see if it helps.  Your symptoms are very suspicious for this condition, as discussed, we will start you on an antibiotic to see if this helps.

## 2023-12-30 NOTE — Progress Notes (Signed)
 Agree with assessment/plan. CT AP 5/1  Magnus Schuller, MD [facility] [phone]

## 2024-01-02 ENCOUNTER — Other Ambulatory Visit (INDEPENDENT_AMBULATORY_CARE_PROVIDER_SITE_OTHER)

## 2024-01-02 ENCOUNTER — Other Ambulatory Visit: Payer: Self-pay | Admitting: Physician Assistant

## 2024-01-02 DIAGNOSIS — D751 Secondary polycythemia: Secondary | ICD-10-CM

## 2024-01-02 DIAGNOSIS — D509 Iron deficiency anemia, unspecified: Secondary | ICD-10-CM | POA: Diagnosis not present

## 2024-01-02 LAB — CBC WITH DIFFERENTIAL/PLATELET
Basophils Absolute: 0.1 10*3/uL (ref 0.0–0.1)
Basophils Relative: 1 % (ref 0.0–3.0)
Eosinophils Absolute: 0.1 10*3/uL (ref 0.0–0.7)
Eosinophils Relative: 1.4 % (ref 0.0–5.0)
HCT: 46.5 % — ABNORMAL HIGH (ref 36.0–46.0)
Hemoglobin: 15.6 g/dL — ABNORMAL HIGH (ref 12.0–15.0)
Lymphocytes Relative: 22.7 % (ref 12.0–46.0)
Lymphs Abs: 1.7 10*3/uL (ref 0.7–4.0)
MCHC: 33.5 g/dL (ref 30.0–36.0)
MCV: 95.4 fl (ref 78.0–100.0)
Monocytes Absolute: 0.6 10*3/uL (ref 0.1–1.0)
Monocytes Relative: 8.1 % (ref 3.0–12.0)
Neutro Abs: 5 10*3/uL (ref 1.4–7.7)
Neutrophils Relative %: 66.8 % (ref 43.0–77.0)
Platelets: 244 10*3/uL (ref 150.0–400.0)
RBC: 4.88 Mil/uL (ref 3.87–5.11)
RDW: 13.2 % (ref 11.5–15.5)
WBC: 7.4 10*3/uL (ref 4.0–10.5)

## 2024-01-02 LAB — IBC + FERRITIN
Ferritin: 84.4 ng/mL (ref 10.0–291.0)
Iron: 126 ug/dL (ref 42–145)
Saturation Ratios: 37.7 % (ref 20.0–50.0)
TIBC: 334.6 ug/dL (ref 250.0–450.0)
Transferrin: 239 mg/dL (ref 212.0–360.0)

## 2024-01-03 ENCOUNTER — Telehealth: Payer: Self-pay | Admitting: Physician Assistant

## 2024-01-03 ENCOUNTER — Ambulatory Visit (HOSPITAL_BASED_OUTPATIENT_CLINIC_OR_DEPARTMENT_OTHER)
Admission: RE | Admit: 2024-01-03 | Discharge: 2024-01-03 | Disposition: A | Discharge status: Home / Self Care | Source: Ambulatory Visit | Attending: Physician Assistant | Admitting: Physician Assistant

## 2024-01-03 ENCOUNTER — Other Ambulatory Visit: Payer: Self-pay

## 2024-01-03 DIAGNOSIS — R1032 Left lower quadrant pain: Secondary | ICD-10-CM

## 2024-01-03 DIAGNOSIS — D751 Secondary polycythemia: Secondary | ICD-10-CM

## 2024-01-03 MED ORDER — IOHEXOL 300 MG/ML  SOLN
100.0000 mL | Freq: Once | INTRAMUSCULAR | Status: AC | PRN
Start: 2024-01-03 — End: 2024-01-03
  Administered 2024-01-03: 100 mL via INTRAVENOUS

## 2024-01-03 NOTE — Telephone Encounter (Signed)
 Patient called and stated that she received a call from [place]. Patient is requesting a call back. Please advise.

## 2024-01-04 NOTE — Telephone Encounter (Signed)
 Inbound call from patient, returning brook[place]'s call from 5/1.

## 2024-01-04 NOTE — Telephone Encounter (Signed)
 Patient called again and stated that she was wanting to speak to Jacqueline Howard regarding her imaging results. Patient stated that she will be leaving with her husband for a appointment she the best method of contact would be her mobile number. Patient is requesting a call back. Please advise.

## 2024-01-07 NOTE — Telephone Encounter (Signed)
 Patient notified of the results of the CT scan.  She is aware she will be contacted back once Santina Cull, [place] has an opportunity to review her notes and develop a plan.    Patient is only available in the am from 8-12.

## 2024-01-07 NOTE — Telephone Encounter (Signed)
 Inbound call from patient, following up on call below. Patient was advised Loran Rock was out of the office, but one of the other nurses would contact her or Loran Rock would reach out tomorrow.

## 2024-01-08 ENCOUNTER — Telehealth: Payer: Self-pay | Admitting: Physician Assistant

## 2024-01-08 DIAGNOSIS — K219 Gastro-esophageal reflux disease without esophagitis: Secondary | ICD-10-CM

## 2024-01-08 DIAGNOSIS — R1013 Epigastric pain: Secondary | ICD-10-CM

## 2024-01-08 MED ORDER — METRONIDAZOLE 250 MG PO TABS: 250.0000 mg | ORAL_TABLET | Freq: Three times a day (TID) | ORAL | 0 refills | Status: AC

## 2024-01-08 NOTE — Telephone Encounter (Signed)
-----   Message from Nurse Druscilla Gerhard sent at 01/07/2024  5:23 PM EDT ----- I spoke with the patient tonight.  See phone notes (duplicate note).  Patient notified of the results and all questions answered.  She reports continued pain.  She describes as epigastric, "starts under my right or left breast and travels down".  Pain wakes her up at night and is worse at night.   Mylinda Asa see your recommendation and advise on next steps. Trial of "lateral" .  Patient is only available tomorrow from 8-12.

## 2024-01-08 NOTE — Telephone Encounter (Signed)
 Called patient and reviewed her symptoms she continues to have worsening GERD despite Protonix  twice daily worse at night and of epigastric substernal discomfort. -Try salon pas patches over the counter.  -Continue protonix  40 mg twice a day - Will do trial of flagyl SIBO - will schedule EGD with history of GERD worsening  -Patient will be at a pending ceremony for her niece at 27 so please try to set of EGD before that or potentially tomorrow.

## 2024-01-08 NOTE — Telephone Encounter (Signed)
 Called and spoke with patient. Patient has been scheduled for EGD in the [facility] with Dr. Venice Gillis tomorrow at 7:30 am. Patient knows to be NPO after midnight. Pt's husband is her care partner. Patient is not on any blood thinners and is not a diabetic. Patient verbalized understanding and had no concerns at the end of the call.   Ambulatory referral to GI in epic. Secure chat sent to pre certification team since procedure is tomorrow.

## 2024-01-08 NOTE — Addendum Note (Signed)
 Addended by: Chrisie Jankovich N on: 01/08/2024 09:56 AM   Modules accepted: Orders

## 2024-01-09 ENCOUNTER — Encounter: Admitting: Gastroenterology

## 2024-02-05 ENCOUNTER — Ambulatory Visit: Admitting: Gastroenterology

## 2024-02-05 ENCOUNTER — Encounter: Payer: Self-pay | Admitting: Gastroenterology

## 2024-02-05 VITALS — BP 141/76 | HR 72 | Temp 97.7°F | Resp 11 | Ht 62.0 in | Wt 177.0 lb

## 2024-02-05 DIAGNOSIS — K2970 Gastritis, unspecified, without bleeding: Secondary | ICD-10-CM | POA: Diagnosis not present

## 2024-02-05 DIAGNOSIS — K2100 Gastro-esophageal reflux disease with esophagitis, without bleeding: Secondary | ICD-10-CM

## 2024-02-05 DIAGNOSIS — K5710 Diverticulosis of small intestine without perforation or abscess without bleeding: Secondary | ICD-10-CM | POA: Diagnosis not present

## 2024-02-05 DIAGNOSIS — K3189 Other diseases of stomach and duodenum: Secondary | ICD-10-CM

## 2024-02-05 DIAGNOSIS — K317 Polyp of stomach and duodenum: Secondary | ICD-10-CM

## 2024-02-05 DIAGNOSIS — R1013 Epigastric pain: Secondary | ICD-10-CM

## 2024-02-05 DIAGNOSIS — Q399 Congenital malformation of esophagus, unspecified: Secondary | ICD-10-CM

## 2024-02-05 DIAGNOSIS — K219 Gastro-esophageal reflux disease without esophagitis: Secondary | ICD-10-CM

## 2024-02-05 MED ORDER — SODIUM CHLORIDE 0.9 % IV SOLN
500.0000 mL | Freq: Once | INTRAVENOUS | Status: DC
Start: 2024-02-05 — End: 2024-02-05

## 2024-02-05 NOTE — Progress Notes (Signed)
 Pt sedate, gd SR's, VSS, report to RN

## 2024-02-05 NOTE — Op Note (Signed)
 [facility] Patient Name: Jacqueline Howard Procedure Date: 02/05/2024 2:28 PM MRN: [id number] Endoscopist: Lajuan Pila , MD, [id number] Age: 73 Referring MD:  Date of Birth: [birth year] Gender: Female Account #: [id number] Procedure:                Upper GI endoscopy Indications:              Epigastric abdominal pain. Negative CT Abdo/pelvis                            except for constipation. Medicines:                Monitored Anesthesia Care Procedure:                Pre-Anesthesia Assessment:                           - Prior to the procedure, a History and Physical                            was performed, and patient medications and                            allergies were reviewed. The patient's tolerance of                            previous anesthesia was also reviewed. The risks                            and benefits of the procedure and the sedation                            options and risks were discussed with the patient.                            All questions were answered, and informed consent                            was obtained. Prior Anticoagulants: The patient has                            taken no anticoagulant or antiplatelet agents. ASA                            Grade Assessment: II - A patient with mild systemic                            disease. After reviewing the risks and benefits,                            the patient was deemed in satisfactory condition to                            undergo the procedure.  After obtaining informed consent, the endoscope was                            passed under direct vision. Throughout the                            procedure, the patient's blood pressure, pulse, and                            oxygen saturations were monitored continuously. The                            GIF [id number] #[id number] was introduced through the                            mouth, and advanced to the  second part of duodenum.                            The upper GI endoscopy was accomplished without                            difficulty. The patient tolerated the procedure                            well. Scope In: Scope Out: Findings:                 The lower third of the esophagus was moderately                            tortuous. Biopsies were obtained from the proximal                            and distal esophagus with cold forceps for                            histology of suspected eosinophilic esophagitis.                           The Z-line was regular and was found 35 cm from the                            incisors.                           Localized minimal inflammation characterized by                            congestion (edema) and erythema was found in the                            gastric antrum. Biopsies were taken with a cold                            forceps for histology.  Multiple 4 to 6 mm sessile polyps with no bleeding                            and no stigmata of recent bleeding were found in                            the gastric body. Not biopsied since previously                            deemed to be fundic gland polyps.                           The examined duodenum was normal.                           A 12 mm non-bleeding diverticulum was found in the                            second portion of the duodenum. Complications:            No immediate complications. Estimated Blood Loss:     Estimated blood loss: none. Impression:               - Presbyesopagus.                           - Z-line regular, 35 cm from the incisors.                           - Gastritis. Biopsied.                           - Multiple gastric polyps.                           - Non-bleeding duodenal diverticulum. Recommendation:           - Patient has a contact number available for                            emergencies. The signs and  symptoms of potential                            delayed complications were discussed with the                            patient. Return to normal activities tomorrow.                            Written discharge instructions were provided to the                            patient.                           - Resume previous diet.                           -  Continue present medications including Protonix                             40 mg p.o. twice daily x 4 weeks and then reduce it                            to once a day..                           - Await pathology results.                           - For constipation start MiraLAX 17 g p.o. twice                            daily until a large bowel movement, then once a day.                           - Please give copy of CT Abdo/pelvis report to the                            patient.                           - The findings and recommendations were discussed                            with the patient's family. Lajuan Pila, MD 02/05/2024 2:50:06 PM This report has been signed electronically.

## 2024-02-05 NOTE — Patient Instructions (Addendum)
-  Handout on gastritis provided. -await pathology results. -Continue present medications -Continue Protonix  40 mg twice a day for 4 weeks and then reduce it to once a day -or constipation start MiraLAX 17 g by mouth twice a day until a large bowel movement, then use once a day  YOU HAD AN ENDOSCOPIC PROCEDURE TODAY AT THE [facility]:   Refer to the procedure report that was given to you for any specific questions about what was found during the examination.  If the procedure report does not answer your questions, please call your gastroenterologist to clarify.  If you requested that your care partner not be given the details of your procedure findings, then the procedure report has been included in a sealed envelope for you to review at your convenience later.  YOU SHOULD EXPECT: Some feelings of bloating in the abdomen. Passage of more gas than usual.  Walking can help get rid of the air that was put into your GI tract during the procedure and reduce the bloating. If you had a lower endoscopy (such as a colonoscopy or flexible sigmoidoscopy) you may notice spotting of blood in your stool or on the toilet paper. If you underwent a bowel prep for your procedure, you may not have a normal bowel movement for a few days.  Please Note:  You might notice some irritation and congestion in your nose or some drainage.  This is from the oxygen used during your procedure.  There is no need for concern and it should clear up in a day or so.  SYMPTOMS TO REPORT IMMEDIATELY:  Following upper endoscopy (EGD)  Vomiting of blood or coffee ground material  New chest pain or pain under the shoulder blades  Painful or persistently difficult swallowing  New shortness of breath  Fever of 100F or higher  Black, tarry-looking stools  For urgent or emergent issues, a gastroenterologist can be reached at any hour by calling [phone]. Do not use MyChart messaging for urgent concerns.    DIET:  We  do recommend a small meal at first, but then you may proceed to your regular diet.  Drink plenty of fluids but you should avoid alcoholic beverages for 24 hours.  ACTIVITY:  You should plan to take it easy for the rest of today and you should NOT DRIVE or use heavy machinery until tomorrow (because of the sedation medicines used during the test).    FOLLOW UP: Our staff will call the number listed on your records the next business day following your procedure.  We will call around 7:15- 8:00 am to check on you and address any questions or concerns that you may have regarding the information given to you following your procedure. If we do not reach you, we will leave a message.     If any biopsies were taken you will be contacted by phone or by letter within the next 1-3 weeks.  Please call us  at [phone] if you have not heard about the biopsies in 3 weeks.    SIGNATURES/CONFIDENTIALITY: You and/or your care partner have signed paperwork which will be entered into your electronic medical record.  These signatures attest to the fact that that the information above on your After Visit Summary has been reviewed and is understood.  Full responsibility of the confidentiality of this discharge information lies with you and/or your care-partner.

## 2024-02-05 NOTE — Progress Notes (Signed)
 Called to room to assist during endoscopic procedure.  Patient ID and intended procedure confirmed with present staff. Received instructions for my participation in the procedure from the performing physician.

## 2024-02-05 NOTE — Progress Notes (Signed)
Vitals-CW  Pt's states no medical or surgical changes since previsit or office visit. 

## 2024-02-05 NOTE — Progress Notes (Signed)
 12/14/2023 Jacqueline Howard [id number] 04-Mar-1951   Referring provider: Edra Govern* Primary GI doctor: Dr. Venice Gillis   ASSESSMENT AND PLAN:    Substernal chest pain x Jan, comes and goes, comes back every year for several years. Had one episode of vomiting, get full very quickly, no weight loss Constant, with burping, no dysphagia, no melena She was on omeprazole this stopped working, started on pantoprazole  that also did not help, started on mylanta at night that is helping but still waking up with the pain HIDA negative 03/2022 per patient, unable to see report AB US  2023 unable to see report - CT AB and pelvis with contrast to evaluate -increase pantoprazole  to BID -  From physical exam and history, likely costochondritis- do lidocaine patches or voltern gel. Follow up with PCP - consider repeat EGD versus GES - possible trial of flagyl  for SIBO - follow up in 2 months   Personal history of adenomatous polyps 09/29/2021 colonoscopy good prep pancolonic diverticulosis nonbleeding internal hemorrhoids no specimens collected Does not need a recall   GERD with history of dysphagia 09/29/2021 EGD for dysphagia presbyesophagus status post esophageal dilation, gastritis, gastric polyps resected and retrieved nonbleeding duodenal diverticulum, reflux esophagitis negative H. pylori, fundic polyps Labs reviewed from 09/14/2023 show no anemia normal liver function, Hgb elevated at 15, normal iron and ferritin at 46   History of chest pain 12/2020 CT coronary with coronary calcium  score of 0   Patient Care Team: Despina Floro, NP as PCP - General (Internal Medicine)   HISTORY OF PRESENT ILLNESS: 73 y.o. female with a past medical history listed below presents for evaluation of GERD and abdominal pain.    Patient known to Dr. Venice Gillis last seen in the office 07/2021 for GERD with occasional dysphagia and history of colon polyps. EGD status post dilation, colonoscopy  diverticulosis and bleeding hemorrhoids, benign biopsies.   Discussed the use of AI scribe software for clinical note transcription with the patient, who gave verbal consent to proceed.   History of Present Illness   Jacqueline Howard "Jacqueline Howard" is a 73 year old female with gastroesophageal reflux disease who presents with persistent abdominal pain and gas.   She experiences persistent abdominal pain and excessive gas. The pain is constant, located in the upper abdomen, and sometimes radiates to the left breast. It is present throughout the day and night, worsening when she wakes up in the middle of the night. She describes the pain as significant and notes it is accompanied by burping and expelling of gas.   She has a history of gastroesophageal reflux disease and underwent an endoscopy in 2023 for trouble swallowing, which showed gastritis and reflux esophagitis. She was previously treated with omeprazole and pantoprazole , but these medications have become ineffective over time. She also tried sucralfate, which caused diarrhea, leading her to discontinue it. Recently, she has been using Mylanta, which provides temporary relief but does not prevent her from waking up in pain.   She reports a single episode of vomiting after eating a cheese sandwich and potato chips, but no recurrent nausea or vomiting. She experiences early satiety and has reduced her food intake, although she has not lost weight. Her bowel movements are daily, light brown in color, and she occasionally misses a day.   Her past medical history includes a colonoscopy in 2023 that showed diverticulosis and hemorrhoids. She has had her heart and breasts checked in the past, with a coronary calcium  score of zero in 2022.  She is not diabetic and has a history of low blood pressure, which has slightly increased since the onset of her current symptoms.       She  reports that she has never smoked. She has never used smokeless tobacco. She  reports current alcohol use. She reports that she does not use drugs.   RELEVANT GI HISTORY, IMAGING AND LABS: Results   DIAGNOSTIC Endoscopy: Reflux esophagitis, gastritis, no strictures (2023) Colonoscopy: Diverticulosis, hemorrhoids (2023) Coronary calcium  score: 0, no plaque (2022)       CBC Labs (Brief)  No results found for: "WBC", "RBC", "HGB", "HCT", "PLT", "MCV", "MCH", "MCHC", "RDW", "LYMPHSABS", "MONOABS", "EOSABS", "BASOSABS"   Recent Labs (within last 365 days)  No results for input(s): "HGB" in the last 8760 hours.     CMP     Labs (Brief)          Component Value Date/Time    NA 141 11/26/2020 1044    K 4.0 11/26/2020 1044    CL 101 11/26/2020 1044    CO2 22 11/26/2020 1044    GLUCOSE 114 (H) 11/26/2020 1044    BUN 9 11/26/2020 1044    CREATININE 0.96 11/26/2020 1044    CALCIUM  9.3 11/26/2020 1044            No data to display             Current Medications:    Current Outpatient Medications (Endocrine & Metabolic):    levothyroxine (SYNTHROID) 88 MCG tablet, Take 88 mcg by mouth every morning.   Current Outpatient Medications (Cardiovascular):    amLODipine (NORVASC) 2.5 MG tablet, Take 2.5 mg by mouth daily.   losartan (COZAAR) 50 MG tablet, Take 50 mg by mouth 2 (two) times daily.   nitroGLYCERIN  (NITROSTAT ) 0.4 MG SL tablet, Place 0.4 mg under the tongue every 5 (five) minutes as needed for chest pain.         Current Outpatient Medications (Other):    LORazepam (ATIVAN) 0.5 MG tablet, Take 0.25 mg by mouth every 8 (eight) hours as needed for anxiety.   Multiple Vitamin (MULTIVITAMIN) tablet, Take 1 tablet by mouth daily.   polycarbophil (FIBERCON) 625 MG tablet, Take 625 mg by mouth daily.   VITAMIN D PO, Take by mouth. Most days   pantoprazole  (PROTONIX ) 40 MG tablet, Take 1 tablet (40 mg total) by mouth 2 (two) times daily before a meal.   Medical History:      Past Medical History:  Diagnosis Date   Acquired hypothyroidism  10/30/2015    Last Assessment & Plan:  Formatting of this note might be different from the original. Relevant Hx: Course: Daily Update: Today's Plan:updated TSH today on her current dose of med  Electronically signed by: Despina Floro, NP 11/02/15 1144 Formatting of this note might be different from the original. Last Assessment & Plan:  Relevant Hx: Course: Daily Update: Today's Plan:updated TSH t   Adnexal mass 11/21/2018   Angina pectoris (HCC) 11/12/2020   Anxiety 11/06/2016   Aortic atherosclerosis (HCC) 02/14/2021   Breast pain 03/22/2022   Chest pain in adult 05/30/2016   Confusion 03/30/2016    Formatting of this note might be different from the original. Last Assessment & Plan:  She needs to have MRI of her brain set up and will need CMP and lipids , updated TSH and CBC but will do labs at the hospital when MRI done, set up carotid dopplers as well   Costochondritis 01/12/2016  Formatting of this note might be different from the original. Last Assessment & Plan:  Relevant Hx: Course: Daily Update: Today's Plan:we discussed this in depth today and she is going to use heat for her discomfort and she is going to avoid the NSAIDS at this time which typically bother her and suspect her putting out mulch was the problem in the fact she was carrying bales of it bilaterally and    Degeneration of lumbosacral intervertebral disc 10/30/2015    Last Assessment & Plan:  Formatting of this note might be different from the original. Relevant Hx: Course: Daily Update: Today's Plan:this is stable for her at this time work on diet and exercise  Electronically signed by: Despina Floro, NP 11/02/15 1145 Formatting of this note might be different from the original. Last Assessment & Plan:  Relevant Hx: Course: Daily Update: Today's P   Diverticulitis of large intestine without perforation or abscess with bleeding 01/20/2016    Formatting of this note might be different from the  original. Last Assessment & Plan:  Relevant Hx: Course: Daily Update: Today's Plan:she is going to have CBC and CMP drawn as well as she is going to have oral abx sent in for this and discussed with her if her S/S worsen or if she is having abnormal labs then we will have her seen by GI sooner than her JUne appt she was able to get herself with D   Diverticulosis 01/20/2016    Last Assessment & Plan:  Formatting of this note might be different from the original. Relevant Hx: Course: Daily Update: Today's Plan:she is going to have CBC and CMP drawn as well as she is going to have oral abx sent in for this and discussed with her if her S/S worsen or if she is having abnormal labs then we will have her seen by GI sooner than her JUne appt she was able to get herself with D   Gastroesophageal reflux disease without esophagitis 10/30/2015    Last Assessment & Plan:  Formatting of this note might be different from the original. Relevant Hx: Course: Daily Update: Today's Plan:this is stable for her on the dexilant  Electronically signed by: Despina Floro, NP 11/02/15 1144 Formatting of this note might be different from the original. Last Assessment & Plan:  Relevant Hx: Course: Daily Update: Today's Plan:this is stable for    History of anemia 03/25/2017   History of colon polyps     IBS (irritable bowel syndrome)     Low back pain without sciatica 11/02/2015    Formatting of this note might be different from the original. Last Assessment & Plan:  Relevant Hx: Course: Daily Update: Today's Plan:update her UA here today to ensure that there is not anything wrong with her intermittent back pain  Electronically signed by: Despina Floro, NP 11/02/15 1149   Obesity (BMI 30.0-34.9) 11/12/2020   Osteopenia 10/30/2015    Last Assessment & Plan:  Formatting of this note might be different from the original. Relevant Hx: Course: Daily Update: Today's Plan:she is working on taking her calcium  and  vitamin daily  Electronically signed by: Despina Floro, NP 11/02/15 1146 Formatting of this note might be different from the original. Last Assessment & Plan:  Relevant Hx: Course: Daily Update: Today's Plan:she   Pure hypercholesterolemia 10/30/2015    Last Assessment & Plan:  Formatting of this note might be different from the original. Reviewed with her that her last lipids were  elevated and that is weight mediated as she has gained about 30 pounds back that she had lost doing her gluten free diet which she is no longer doing. Formatting of this note might be different from the original. Last Assessment & Plan:  Reviewed with her that her last   Refusal of statin medication by patient 10/18/2020   Vitamin D deficiency disease 10/30/2015    Last Assessment & Plan:  Formatting of this note might be different from the original. Relevant Hx: Course: Daily Update: Today's Plan:discussed with her today and she is to have her level checked and she is going to then have her med sent in  Electronically signed by: Despina Floro, NP 11/02/15 1148 Formatting of this note might be different from the original. Last Assessment & Plan:        Allergies:  Allergies       Allergies  Allergen Reactions   Ezetimibe        Myalgias (intolerance) Joint stiffness   Nsaids Nausea And Vomiting   Pravastatin Sodium Nausea And Vomiting   Rosuvastatin  Other (See Comments)      myalgias   Sucralfate Diarrhea   Codeine Nausea And Vomiting and Nausea Only        Surgical History:  She  has a past surgical history that includes Oophorectomy; Vaginal hysterectomy; Rhinoplasty; Toe Surgery; Esophagogastroduodenoscopy (06/06/2017); and Colonoscopy (03/08/2016). Family History:  Her family history includes Colon cancer in her cousin; Hyperlipidemia in her mother; Hypertension in her father and mother; Stomach cancer in her paternal grandmother.   REVIEW OF SYSTEMS  : All other systems  reviewed and negative except where noted in the History of Present Illness.   PHYSICAL EXAM: BP 118/70   Pulse 77   Ht 5\' 2"  (1.575 m)   Wt 177 lb (80.3 kg)   SpO2 97%   BMI 32.37 kg/m  Physical Exam   GENERAL APPEARANCE: Well nourished, in no apparent distress. HEENT: No cervical lymphadenopathy, unremarkable thyroid, sclerae anicteric, conjunctiva pink. RESPIRATORY: Respiratory effort normal, BS equal bilateral without rales, rhonchi, wheezing. Tenderness on palpation of chest wall. CARDIO: RRR with no MRGs, peripheral pulses intact. ABDOMEN: Soft, non distended, active bowel sounds in all 4 quadrants, epigastric tenderness on palpation, no rebound, no mass appreciated. RECTAL: Declines. MUSCULOSKELETAL: Full ROM, normal gait, without edema. SKIN: Dry, intact without rashes or lesions. No jaundice. NEURO: Alert, oriented, no focal deficits. PSYCH: Cooperative, normal mood and affect.       Edmonia Gottron, PA-C     Attending physician's note   I have taken history, reviewed the chart and examined the patient. I performed a substantive portion of this encounter, including complete performance of at least one of the key components, in conjunction with the APP. I agree with the Advanced Practitioner's note, impression and recommendations.   CT AP 5/1  1. No acute findings or explanation for the patient's symptoms. 2. Moderate distal colonic diverticulosis without evidence of acute inflammation. 3. Mild hepatic steatosis. 4.  Aortic Atherosclerosis (ICD10-I70.0).   Magnus Schuller, MD [facility] [phone]

## 2024-02-06 ENCOUNTER — Telehealth: Payer: Self-pay

## 2024-02-06 NOTE — Telephone Encounter (Signed)
  Follow up Call-     02/05/2024    1:25 PM 02/05/2024    1:20 PM 09/29/2021    9:41 AM  Call back number  Post procedure Call Back phone  # [phone]  [phone]  Permission to leave phone message  Yes Yes     Patient questions:  Do you have a fever, pain , or abdominal swelling? No. Pain Score  0 *  Have you tolerated food without any problems? Yes.    Have you been able to return to your normal activities? Yes.    Do you have any questions about your discharge instructions: Diet   No. Medications  No. Follow up visit  No.  Do you have questions or concerns about your Care? No.  Actions: * If pain score is 4 or above: No action needed, pain <4.

## 2024-02-08 LAB — SURGICAL PATHOLOGY

## 2024-02-12 ENCOUNTER — Ambulatory Visit: Payer: Self-pay | Admitting: Gastroenterology

## 2024-02-18 ENCOUNTER — Ambulatory Visit: Admitting: Physician Assistant

## 2024-02-26 ENCOUNTER — Encounter: Payer: Self-pay | Admitting: Physician Assistant

## 2024-02-26 ENCOUNTER — Ambulatory Visit (INDEPENDENT_AMBULATORY_CARE_PROVIDER_SITE_OTHER): Admitting: Physician Assistant

## 2024-02-26 VITALS — BP 108/70 | HR 72 | Ht 60.75 in | Wt 178.0 lb

## 2024-02-26 DIAGNOSIS — R6881 Early satiety: Secondary | ICD-10-CM

## 2024-02-26 DIAGNOSIS — R0789 Other chest pain: Secondary | ICD-10-CM

## 2024-02-26 DIAGNOSIS — K219 Gastro-esophageal reflux disease without esophagitis: Secondary | ICD-10-CM | POA: Diagnosis not present

## 2024-02-26 DIAGNOSIS — R1013 Epigastric pain: Secondary | ICD-10-CM

## 2024-02-26 DIAGNOSIS — R1110 Vomiting, unspecified: Secondary | ICD-10-CM

## 2024-02-26 DIAGNOSIS — K5900 Constipation, unspecified: Secondary | ICD-10-CM

## 2024-02-26 DIAGNOSIS — Z860101 Personal history of adenomatous and serrated colon polyps: Secondary | ICD-10-CM

## 2024-02-26 MED ORDER — LINACLOTIDE 72 MCG PO CAPS: 72.0000 ug | ORAL_CAPSULE | Freq: Every day | ORAL | Status: AC

## 2024-02-26 MED ORDER — LINACLOTIDE 145 MCG PO CAPS: 145.0000 ug | ORAL_CAPSULE | Freq: Every day | ORAL | 0 refills | Status: AC

## 2024-02-26 NOTE — Patient Instructions (Addendum)
 Linzess 72 mcg daily rather than miralax, can increase to the 145 mcg *IBS-C patients may begin to experience relief from belly pain and overall abdominal symptoms (pain, discomfort, and bloating) in about 1 week,  with symptoms typically improving over 12 weeks.  Take at least 30 minutes before the first meal of the day on an empty stomach You can have a loose stool if you eat a high-fat breakfast. Give it at least 7 days, may have more bowel movements during that time.   The diarrhea should go away and you should start having normal, complete, full bowel movements.  It may be helpful to start treatment when you can be near the comfort of your own bathroom, such as a weekend.  After you are out we can send in a prescription if you did well, there is a prescription card  Toileting tips to help with your constipation - Drink at least 64-80 ounces of water/liquid per day. - Establish a time to try to move your bowels every day.  For many people, this is after a cup of coffee or after a meal such as breakfast. - Sit all of the way back on the toilet keeping your back fairly straight and while sitting up, try to rest the tops of your forearms on your upper thighs.   - Raising your feet with a step stool/squatty potty can be helpful to improve the angle that allows your stool to pass through the rectum. - Relax the rectum feeling it bulge toward the toilet water.  If you feel your rectum raising toward your body, you are contracting rather than relaxing. - Breathe in and slowly exhale. Belly breath by expanding your belly towards your belly button. Keep belly expanded as you gently direct pressure down and back to the anus.  A low pitched GRRR sound can assist with increasing intra-abdominal pressure.  (Can also trying to blow on a pinwheel and make it move, this helps with the same belly breathing) - Repeat 3-4 times. If unsuccessful, contract the pelvic floor to restore normal tone and get off the  toilet.  Avoid excessive straining. - To reduce excessive wiping by teaching your anus to normally contract, place hands on outer aspect of knees and resist knee movement outward.  Hold 5-10 second then place hands just inside of knees and resist inward movement of knees.  Hold 5 seconds.  Repeat a few times each way.  Go to the ER if unable to pass gas, severe AB pain, unable to hold down food, any shortness of breath of chest pain.   When you go down on the pantoprazole  and symptoms return please let us  know, can add on pepcid 40 mg at night or switch the pantoprazole  to a different medication  Pantoprazole , take this medication 30 minutes to 1 hour before meals- this makes it more effective.  Avoid spicy and acidic foods Avoid fatty foods Limit your intake of coffee, tea, alcohol, and carbonated drinks Work to maintain a healthy weight Keep the head of the bed elevated at least 3 inches with blocks or a wedge pillow if you are having any nighttime symptoms Stay upright for 2 hours after eating Avoid meals and snacks three to four hours before bedtime  Reflux Gourmet Rescue  It is an ALGINATE THERAPY which is the only intervention that works to safeguard the esophagus by creating a protective barrier that actually stops reflux from happening. -The general directions for use are as stated on the packaging:  Take 1 teaspoon (5 ml), or more as needed or as directed by your physician, after meals and before bed. -These general directions address the most common times for reflux to occur, but our Rescue products may be taken anytime. Some individuals may take our product preemptively, when they know they will suffer from reflux, or as needed - when discomfort arises. (If taken around food, it should be consumed last.) -You do not have to take 1 teaspoon (5 ml) of the product. While one teaspoon (5ml) may be the perfect average amount to relieve reflux suffering in some, others may require more or  less. You may adjust the amount of Mint Chocolate Rescue and Vanilla Caramel Rescue to the lowest amount necessary to meet your individual needs to improve your quality of life. -You may dilute the product if it is too viscous for you to consume. Keep in mind, however, that the thickness of the product was formulated to provide optimal coating and protection of your throat and esophagus. Though diluting the product is possible, it may reduce the protective function and/or length of action. -This can be used in conjunction with reflux medications and lifestyle changes.  100% ALL-NATURAL  Paraben FREE, glycerin FREE, & potassium FREE  Made entirely from all-natural ingredients considered safe for children and during pregnancy  No known side effects  All-natural flavor Gluten FREE  Allergen FREE  Vegan  Can find more information here: [employer]co.nz  Gastroparesis Please do small frequent meals like 4-6 meals a day.  Eat and drink liquids at separate times.  Avoid high fiber foods, cook your vegetables, avoid high fat food.  Suggest spreading protein throughout the day (greek yogurt, glucerna, soft meat, milk, eggs) Choose soft foods that you can mash with a fork When you are more symptomatic, change to pureed foods foods and liquids.  Consider reading Living well with Gastroparesis by [facility] Gastroparesis is a condition in which food takes longer than normal to empty from the stomach. This condition is also known as delayed gastric emptying. It is usually a long-term (chronic) condition. There is no cure, but there are treatments and things that you can do at home to help relieve symptoms. Treating the underlying condition that causes gastroparesis can also help relieve symptoms What are the causes? In many cases, the cause of this condition is not known. Possible causes include: A hormone (endocrine) disorder, such as hypothyroidism or  diabetes. A nervous system disease, such as Parkinson's disease or multiple sclerosis. Cancer, infection, or surgery that affects the stomach or vagus nerve. The vagus nerve runs from your chest, through your neck, and to the lower part of your brain. A connective tissue disorder, such as scleroderma. Certain medicines. What increases the risk? You are more likely to develop this condition if: You have certain disorders or diseases. These may include: An endocrine disorder. An eating disorder. Amyloidosis. Scleroderma. Parkinson's disease. Multiple sclerosis. Cancer or infection of the stomach or the vagus nerve. You have had surgery on your stomach or vagus nerve. You take certain medicines. You are female. What are the signs or symptoms? Symptoms of this condition include: Feeling full after eating very little or a loss of appetite. Nausea, vomiting, or heartburn. Bloating of your abdomen. Inconsistent blood sugar (glucose) levels on blood tests. Unexplained weight loss. Acid from the stomach coming up into the esophagus (gastroesophageal reflux). Sudden tightening (spasm) of the stomach, which can be painful. Symptoms may come and go. Some people may not notice any  symptoms. How is this diagnosed? This condition is diagnosed with tests, such as: Tests that check how long it takes food to move through the stomach and intestines. These tests include: Upper gastrointestinal (GI) series. For this test, you drink a liquid that shows up well on X-rays, and then X-rays are taken of your intestines. Gastric emptying scintigraphy. For this test, you eat food that contains a small amount of radioactive material, and then scans are taken. Wireless capsule GI monitoring system. For this test, you swallow a pill (capsule) that records information about how foods and fluid move through your stomach. Gastric manometry. For this test, a tube is passed down your throat and into your stomach to  measure electrical and muscular activity. Endoscopy. For this test, a long, thin tube with a camera and light on the end is passed down your throat and into your stomach to check for problems in your stomach lining. Ultrasound. This test uses sound waves to create images of the inside of your body. This can help rule out gallbladder disease or pancreatitis as a cause of your symptoms. How is this treated? There is no cure for this condition, but treatment and home care may relieve symptoms. Treatment may include: Treating the underlying cause. Managing your symptoms by making changes to your diet and exercise habits. Taking medicines to control nausea and vomiting and to stimulate stomach muscles. Getting food through a feeding tube in the hospital. This may be done in severe cases. Having surgery to insert a device called a gastric electrical stimulator into your body. This device helps improve stomach emptying and control nausea and vomiting. Follow these instructions at home: Take over-the-counter and prescription medicines only as told by your health care provider. Follow instructions from your health care provider about eating or drinking restrictions. Your health care provider may recommend that you: Eat smaller meals more often. Eat low-fat foods. Eat low-fiber forms of high-fiber foods. For example, eat cooked vegetables instead of raw vegetables. Have only liquid foods instead of solid foods. Liquid foods are easier to digest. Drink enough fluid to keep your urine pale yellow. Exercise as often as told by your health care provider. Keep all follow-up visits. This is important. Contact a health care provider if you: Notice that your symptoms do not improve with treatment. Have new symptoms. Get help right away if you: Have severe pain in your abdomen that does not improve with treatment. Have nausea that is severe or does not go away. Vomit every time you drink  fluids. Summary Gastroparesis is a long-term (chronic) condition in which food takes longer than normal to empty from the stomach. Symptoms include nausea, vomiting, heartburn, bloating of your abdomen, and loss of appetite. Eating smaller portions, low-fat foods, and low-fiber forms of high-fiber foods may help you manage your symptoms. Get help right away if you have severe pain in your abdomen. This information is not intended to replace advice given to you by your health care provider. Make sure you discuss any questions you have with your health care provider. Document Revised: 12/29/2019 Document Reviewed: 12/29/2019 Elsevier Patient Education  2021 [employer].

## 2024-02-26 NOTE — Progress Notes (Signed)
 02/26/2024 Dula Havlik [id number] 02/21/1951  Referring provider: Benson Eleanor PARAS* Primary GI doctor: Dr. Charlanne  ASSESSMENT AND PLAN:   Noncardiac chest pain intermittent several years with episode of vomiting and associated early satiety.   History of GERD/dyphagia No associated dysphagia melena or weight loss.  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 pylori gastritis -still on the pantoprazole  BID but planning on decreasing to once a day, has had some improvement of her symptoms, no chest pain Did trial of flagyl  with some help - symptoms return please let us  know, can add on pepcid 40 mg at night or switch the pantoprazole  to a different medication like nexium - consider retreatment with xiphaxin or testing for SIBO - consider GES, given information - follow up in 4-6 months  Constipation Started on miralax BID and now at once a day Continue to have gas/bloating  - Increase fiber/ water intake, decrease caffeine, increase activity level. -Will add on Linzess 72 and 145 samples given rather than miralax -possible component of pelvis floor dysfunction with history and symptoms.   Personal history of adenomatous polyps 09/29/2021 colonoscopy good prep pancolonic diverticulosis nonbleeding internal hemorrhoids no specimens collected Does not need a recall  History of chest pain 12/2020 CT coronary with coronary calcium  score of 0  Patient Care Team: Benson Eleanor Rung, NP as PCP - General (Internal Medicine)  HISTORY OF PRESENT ILLNESS: 73 y.o. female with a past medical history  listed below presents for evaluation of GERD and abdominal pain.   Patient known by myself last seen in the office 12/2023  Discussed the use of AI scribe software for clinical note transcription with the patient, who gave verbal consent to proceed.  History of Present Illness   Satara Virella is a 73 year old female who presents for follow-up of non-cardiac chest pain and gastrointestinal symptoms.  She experiences ongoing non-cardiac chest pain, which has improved since her last visit. A CT scan in May was normal, and an endoscopy on June 3rd revealed inflammation in the stomach and esophagus, polyps, and a small pocket in the duodenum. Pathology confirmed reflux esophagitis. She was started on pantoprazole  twice daily, which has helped, although some discomfort persists.  She has constipation and initially took MiraLAX twice daily, then reduced to once daily, with some improvement in symptoms. She still experiences bloating and gas. Her symptoms were not well managed while on vacation, and she plans to purchase new MiraLAX as the current supply is expiring.  No nausea, but she reports early satiety, stating she doesn't eat as much as she used to. Her pain is more in the lower abdomen rather than the chest. She previously took an antibiotic for ten days, which she found unpleasant but believes it may have helped with her symptoms. She continues to experience some discomfort and bloating, which she attributes to constipation.      She  reports that she has never smoked. She has never used smokeless tobacco. She reports current alcohol use. She reports that she does not use drugs.  RELEVANT GI HISTORY, IMAGING AND LABS: Results   RADIOLOGY CT scan: normal (01/2024)  DIAGNOSTIC Endoscopy: inflammation in stomach, polyps, pocket in duodenum (02/05/2024)  PATHOLOGY Reflux esophagitis, inflammation in esophagus, negative for infection, negative for eosinophilic esophagitis, negative  for dysplasia or abnormal cells (02/05/2024)      CBC    Component Value Date/Time   WBC 7.4 01/02/2024 1118   RBC 4.88 01/02/2024 1118   HGB 15.6 (H) 01/02/2024 1118   HCT 46.5 (H) 01/02/2024 1118   PLT 244.0 01/02/2024 1118   MCV 95.4 01/02/2024 1118   MCHC 33.5 01/02/2024 1118   RDW 13.2 01/02/2024 1118   LYMPHSABS 1.7 01/02/2024 1118   MONOABS 0.6 01/02/2024 1118   EOSABS 0.1 01/02/2024 1118   BASOSABS 0.1 01/02/2024 1118   Recent Labs    12/14/23 1540 01/02/24 1118  HGB 15.8* 15.6*    CMP     Component Value Date/Time   NA 140 12/14/2023 1540   NA 141 11/26/2020 1044   K 3.9 12/14/2023 1540   CL 104 12/14/2023 1540   CO2 27 12/14/2023 1540   GLUCOSE 97 12/14/2023 1540   BUN 13 12/14/2023 1540   BUN 9 11/26/2020 1044   CREATININE 0.83 12/14/2023 1540   CALCIUM  9.5 12/14/2023 1540   PROT 7.5 12/14/2023 1540   ALBUMIN 4.6 12/14/2023 1540   AST 14 12/14/2023 1540   ALT 16 12/14/2023 1540   ALKPHOS 98 12/14/2023 1540   BILITOT 0.5 12/14/2023 1540      Latest Ref Rng & Units 12/14/2023    3:40 PM  Hepatic Function  Total Protein 6.0 - 8.3 g/dL 7.5   Albumin 3.5 - 5.2 g/dL 4.6   AST 0 - 37 U/L 14   ALT 0 - 35 U/L 16   Alk Phosphatase 39 - 117 U/L 98   Total Bilirubin 0.2 - 1.2 mg/dL 0.5       Current Medications:   Current Outpatient Medications (Endocrine & Metabolic):    levothyroxine (SYNTHROID) 88 MCG tablet, Take 88 mcg by mouth every morning.  Current Outpatient Medications (Cardiovascular):    amLODipine (NORVASC) 2.5 MG tablet, Take 2.5 mg by mouth daily.   losartan (COZAAR) 50 MG tablet, Take 50 mg by mouth 2 (two) times daily.   nitroGLYCERIN  (NITROSTAT ) 0.4 MG SL tablet, Place 0.4 mg under the tongue every 5 (five) minutes as needed for chest pain.     Current Outpatient Medications (Other):    LORazepam (ATIVAN) 0.5 MG tablet, Take 0.25 mg by mouth every 8 (eight) hours as needed for anxiety.   Multiple Vitamin (MULTIVITAMIN) tablet,  Take 1 tablet by mouth daily.   pantoprazole  (PROTONIX ) 40 MG tablet, Take 1 tablet (40 mg total) by mouth 2 (two) times daily before a meal.   polycarbophil (FIBERCON) 625 MG tablet, Take 625 mg by mouth daily.   VITAMIN D PO, Take by mouth. Most days  Medical History:  Past Medical History:  Diagnosis Date   Acquired hypothyroidism 10/30/2015   Last Assessment & Plan:  Formatting of this note might be different from the original. Relevant Hx: Course: Daily Update: Today's Plan:updated TSH today on her current dose of med  Electronically signed by: Eleanor Merlynn Lady, NP 11/02/15 1144 Formatting of this note might be different from the original. Last Assessment & Plan:  Relevant Hx: Course: Daily Update: Today's Plan:updated TSH t   Adnexal mass 11/21/2018   Angina pectoris (HCC) 11/12/2020   Anxiety 11/06/2016   Aortic atherosclerosis (HCC) 02/14/2021   Arthritis    Breast pain 03/22/2022   Chest pain in adult 05/30/2016   Confusion 03/30/2016   Formatting of  this note might be different from the original. Last Assessment & Plan:  She needs to have MRI of her brain set up and will need CMP and lipids , updated TSH and CBC but will do labs at the hospital when MRI done, set up carotid dopplers as well   Costochondritis 01/12/2016   Formatting of this note might be different from the original. Last Assessment & Plan:  Relevant Hx: Course: Daily Update: Today's Plan:we discussed this in depth today and she is going to use heat for her discomfort and she is going to avoid the NSAIDS at this time which typically bother her and suspect her putting out mulch was the problem in the fact she was carrying bales of it bilaterally and    Degeneration of lumbosacral intervertebral disc 10/30/2015   Last Assessment & Plan:  Formatting of this note might be different from the original. Relevant Hx: Course: Daily Update: Today's Plan:this is stable for her at this time work on diet and exercise   Electronically signed by: Eleanor Merlynn Lady, NP 11/02/15 1145 Formatting of this note might be different from the original. Last Assessment & Plan:  Relevant Hx: Course: Daily Update: Today's P   Diverticulitis of large intestine without perforation or abscess with bleeding 01/20/2016   Formatting of this note might be different from the original. Last Assessment & Plan:  Relevant Hx: Course: Daily Update: Today's Plan:she is going to have CBC and CMP drawn as well as she is going to have oral abx sent in for this and discussed with her if her S/S worsen or if she is having abnormal labs then we will have her seen by GI sooner than her JUne appt she was able to get herself with D   Diverticulosis 01/20/2016   Last Assessment & Plan:  Formatting of this note might be different from the original. Relevant Hx: Course: Daily Update: Today's Plan:she is going to have CBC and CMP drawn as well as she is going to have oral abx sent in for this and discussed with her if her S/S worsen or if she is having abnormal labs then we will have her seen by GI sooner than her JUne appt she was able to get herself with D   Gastroesophageal reflux disease without esophagitis 10/30/2015   Last Assessment & Plan:  Formatting of this note might be different from the original. Relevant Hx: Course: Daily Update: Today's Plan:this is stable for her on the dexilant  Electronically signed by: Eleanor Merlynn Lady, NP 11/02/15 1144 Formatting of this note might be different from the original. Last Assessment & Plan:  Relevant Hx: Course: Daily Update: Today's Plan:this is stable for    History of anemia 03/25/2017   History of colon polyps    Hypertension    IBS (irritable bowel syndrome)    Low back pain without sciatica 11/02/2015   Formatting of this note might be different from the original. Last Assessment & Plan:  Relevant Hx: Course: Daily Update: Today's Plan:update her UA here today to ensure that there is  not anything wrong with her intermittent back pain  Electronically signed by: Eleanor Merlynn Lady, NP 11/02/15 1149   Obesity (BMI 30.0-34.9) 11/12/2020   Osteopenia 10/30/2015   Last Assessment & Plan:  Formatting of this note might be different from the original. Relevant Hx: Course: Daily Update: Today's Plan:she is working on taking her calcium  and vitamin daily  Electronically signed by: Eleanor Merlynn Lady, NP 11/02/15 1146 Formatting  of this note might be different from the original. Last Assessment & Plan:  Relevant Hx: Course: Daily Update: Today's Plan:she   Osteoporosis    Pure hypercholesterolemia 10/30/2015   Last Assessment & Plan:  Formatting of this note might be different from the original. Reviewed with her that her last lipids were elevated and that is weight mediated as she has gained about 30 pounds back that she had lost doing her gluten free diet which she is no longer doing. Formatting of this note might be different from the original. Last Assessment & Plan:  Reviewed with her that her last   Refusal of statin medication by patient 10/18/2020   Vitamin D deficiency disease 10/30/2015   Last Assessment & Plan:  Formatting of this note might be different from the original. Relevant Hx: Course: Daily Update: Today's Plan:discussed with her today and she is to have her level checked and she is going to then have her med sent in  Electronically signed by: Eleanor Merlynn Lady, NP 11/02/15 1148 Formatting of this note might be different from the original. Last Assessment & Plan:   Allergies:  Allergies  Allergen Reactions   Carafate [Sucralfate] Diarrhea   Rosuvastatin  Other (See Comments)    myalgias   Nsaids Nausea And Vomiting   Pravastatin Sodium Nausea And Vomiting   Zetia [Ezetimibe] Other (See Comments)    Myalgias (intolerance) Joint stiffness   Codeine Nausea And Vomiting and Nausea Only     Surgical History:  She  has a past surgical history  that includes Oophorectomy; Vaginal hysterectomy; Rhinoplasty; Toe Surgery; Esophagogastroduodenoscopy (06/06/2017); and Colonoscopy (03/08/2016). Family History:  Her family history includes Colon cancer in her cousin; Hyperlipidemia in her mother; Hypertension in her father and mother; Stomach cancer in her paternal grandmother.  REVIEW OF SYSTEMS  : All other systems reviewed and negative except where noted in the History of Present Illness.  PHYSICAL EXAM: BP 108/70 (BP Location: Left Arm, Patient Position: Sitting, Cuff Size: Large)   Pulse 72   Ht 5' 0.75 (1.543 m) Comment: height measured without shoes  Wt 178 lb (80.7 kg)   BMI 33.91 kg/m  Physical Exam   GENERAL APPEARANCE: Well nourished, in no apparent distress. HEENT: No cervical lymphadenopathy, unremarkable thyroid, sclerae anicteric, conjunctiva pink. RESPIRATORY: Respiratory effort normal, breath sounds equal bilaterally without rales, rhonchi, or wheezing. CARDIO: Regular rate and rhythm with no murmurs, rubs, or gallops, peripheral pulses intact. ABDOMEN: Soft, non-distended, active bowel sounds in all four quadrants, tenderness in lower region, no rebound, no mass appreciated. RECTAL: Declines. MUSCULOSKELETAL: Full range of motion, normal gait, without edema. SKIN: Dry, intact without rashes or lesions. No jaundice. NEURO: Alert, oriented, no focal deficits. PSYCH: Cooperative, normal mood and affect.      Alan JONELLE Coombs, PA-C 10:45 AM

## 2024-03-04 ENCOUNTER — Telehealth: Payer: Self-pay

## 2024-03-04 NOTE — Telephone Encounter (Signed)
 Reminder in epic that patient was due for repeat CBC. Looks like patient had labs drawn by PCP on 02/27/24, results in Epic for review.

## 2024-03-04 NOTE — Telephone Encounter (Signed)
Noted, letter mailed to patient

## 2024-03-04 NOTE — Telephone Encounter (Signed)
-----   Message from Nurse [place] B sent at 01/03/2024  8:32 AM EDT ----- Regarding: Labs due CBC due - order in epic - Craig

## 2024-03-08 ENCOUNTER — Other Ambulatory Visit: Payer: Self-pay | Admitting: Physician Assistant

## 2024-06-06 ENCOUNTER — Other Ambulatory Visit: Payer: Self-pay | Admitting: Physician Assistant

## 2024-08-15 ENCOUNTER — Encounter: Payer: Self-pay | Admitting: Gastroenterology

## 2024-08-15 ENCOUNTER — Other Ambulatory Visit

## 2024-08-15 ENCOUNTER — Ambulatory Visit: Admitting: Gastroenterology

## 2024-08-15 ENCOUNTER — Other Ambulatory Visit (INDEPENDENT_AMBULATORY_CARE_PROVIDER_SITE_OTHER)

## 2024-08-15 VITALS — BP 150/80 | HR 74 | Ht 61.5 in | Wt 176.5 lb

## 2024-08-15 DIAGNOSIS — R1030 Lower abdominal pain, unspecified: Secondary | ICD-10-CM

## 2024-08-15 DIAGNOSIS — R300 Dysuria: Secondary | ICD-10-CM | POA: Diagnosis not present

## 2024-08-15 DIAGNOSIS — K5900 Constipation, unspecified: Secondary | ICD-10-CM

## 2024-08-15 LAB — CBC WITH DIFFERENTIAL/PLATELET
Basophils Absolute: 0.1 K/uL (ref 0.0–0.1)
Basophils Relative: 1.1 % (ref 0.0–3.0)
Eosinophils Absolute: 0.1 K/uL (ref 0.0–0.7)
Eosinophils Relative: 1.5 % (ref 0.0–5.0)
HCT: 45.6 % (ref 36.0–46.0)
Hemoglobin: 15.4 g/dL — ABNORMAL HIGH (ref 12.0–15.0)
Lymphocytes Relative: 19.6 % (ref 12.0–46.0)
Lymphs Abs: 1.4 K/uL (ref 0.7–4.0)
MCHC: 33.7 g/dL (ref 30.0–36.0)
MCV: 93.6 fl (ref 78.0–100.0)
Monocytes Absolute: 0.7 K/uL (ref 0.1–1.0)
Monocytes Relative: 9.1 % (ref 3.0–12.0)
Neutro Abs: 5 K/uL (ref 1.4–7.7)
Neutrophils Relative %: 68.7 % (ref 43.0–77.0)
Platelets: 250 K/uL (ref 150.0–400.0)
RBC: 4.87 Mil/uL (ref 3.87–5.11)
RDW: 13.7 % (ref 11.5–15.5)
WBC: 7.3 K/uL (ref 4.0–10.5)

## 2024-08-15 LAB — COMPREHENSIVE METABOLIC PANEL WITH GFR
ALT: 15 U/L (ref 0–35)
AST: 12 U/L (ref 0–37)
Albumin: 4.4 g/dL (ref 3.5–5.2)
Alkaline Phosphatase: 118 U/L — ABNORMAL HIGH (ref 39–117)
BUN: 13 mg/dL (ref 6–23)
CO2: 27 meq/L (ref 19–32)
Calcium: 9.7 mg/dL (ref 8.4–10.5)
Chloride: 103 meq/L (ref 96–112)
Creatinine, Ser: 0.92 mg/dL (ref 0.40–1.20)
GFR: 61.47 mL/min (ref >60.00)
Glucose, Bld: 108 mg/dL — ABNORMAL HIGH (ref 70–99)
Potassium: 3.8 meq/L (ref 3.5–5.1)
Sodium: 141 meq/L (ref 135–145)
Total Bilirubin: 0.7 mg/dL (ref 0.2–1.2)
Total Protein: 7.7 g/dL (ref 6.0–8.3)

## 2024-08-15 LAB — URINALYSIS, ROUTINE W REFLEX MICROSCOPIC
Bilirubin Urine: NEGATIVE
Ketones, ur: NEGATIVE
Nitrite: NEGATIVE
Specific Gravity, Urine: 1.02 (ref 1.000–1.030)
Total Protein, Urine: NEGATIVE
Urine Glucose: NEGATIVE
Urobilinogen, UA: 0.2 (ref 0.0–1.0)
pH: 6 (ref 5.0–8.0)

## 2024-08-15 MED ORDER — DICYCLOMINE HCL 10 MG PO CAPS: 10.0000 mg | ORAL_CAPSULE | Freq: Two times a day (BID) | ORAL | 4 refills | Status: AC

## 2024-08-15 NOTE — Progress Notes (Signed)
 02/26/2024 Jacqueline Howard [id number] Nov 09, 1950   Referring provider: Benson Eleanor PARAS* Primary GI doctor: Dr. Charlanne   ASSESSMENT AND PLAN:    Noncardiac chest pain intermittent several years with episode of vomiting and associated early satiety.   History of GERD/dyphagia No associated dysphagia melena or weight loss.  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 pylori gastritis -still on the pantoprazole  BID but planning on decreasing to once a day, has had some improvement of her symptoms, no chest pain Did trial of flagyl  with some help - symptoms return please let us  know, can add on pepcid 40 mg at night or switch the pantoprazole  to a different medication like nexium - consider retreatment with xiphaxin or testing for SIBO - consider GES, given information - follow up in 4-6 months   Constipation Started on miralax BID and now at once a day Continue to have gas/bloating  - Increase fiber/ water intake, decrease caffeine, increase activity level. -Will add on Linzess  72 and 145 samples given rather than miralax -possible component of pelvis floor dysfunction with history and symptoms.    Personal history of adenomatous polyps 09/29/2021 colonoscopy good prep pancolonic diverticulosis nonbleeding internal hemorrhoids no specimens collected Does not need a recall   History of chest pain 12/2020 CT coronary with coronary calcium  score of 0   Patient Care Team: Benson Eleanor Rung, NP as PCP - General (Internal Medicine)   HISTORY OF PRESENT ILLNESS: 73 y.o. female with a past medical history  listed below presents for evaluation of GERD and abdominal pain.    Patient known by myself last seen in the office 12/2023   Discussed the use of AI scribe software for clinical note transcription with the patient, who gave verbal consent to proceed.   History of Present Illness   Jacqueline Howard is a 73 year old female who presents for follow-up of non-cardiac chest pain and gastrointestinal symptoms.   She experiences ongoing non-cardiac chest pain, which has improved since her last visit. A CT scan in May was normal, and an endoscopy on June 3rd revealed inflammation in the stomach and esophagus, polyps, and a small pocket in the duodenum. Pathology confirmed reflux esophagitis. She was started on pantoprazole  twice daily, which has helped, although some discomfort persists.   She has constipation and initially took MiraLAX twice daily, then reduced to once daily, with some improvement in symptoms. She still experiences bloating and gas. Her symptoms were not well managed while on vacation, and she plans to purchase new MiraLAX as the current supply is expiring.   No nausea, but she reports early satiety, stating she doesn't eat as much as she used to. Her pain is more in the lower abdomen rather than the chest. She previously took an antibiotic for ten days, which she found unpleasant but believes it may have helped with her symptoms. She continues to experience some discomfort and bloating, which she attributes to constipation.       She  reports that she has never smoked. She has never used smokeless tobacco. She reports current alcohol use. She reports that she does not use drugs.   RELEVANT GI HISTORY, IMAGING AND LABS: Results   RADIOLOGY CT  scan: normal (01/2024)   DIAGNOSTIC Endoscopy: inflammation in stomach, polyps, pocket in duodenum (02/05/2024)   PATHOLOGY Reflux esophagitis, inflammation in esophagus, negative for infection, negative for eosinophilic esophagitis,  negative for dysplasia or abnormal cells (02/05/2024)       CBC Labs (Brief)          Component Value Date/Time    WBC 7.4 01/02/2024 1118    RBC 4.88 01/02/2024 1118    HGB 15.6 (H) 01/02/2024 1118    HCT 46.5 (H) 01/02/2024 1118    PLT 244.0 01/02/2024 1118    MCV 95.4 01/02/2024 1118    MCHC 33.5 01/02/2024 1118    RDW 13.2 01/02/2024 1118    LYMPHSABS 1.7 01/02/2024 1118    MONOABS 0.6 01/02/2024 1118    EOSABS 0.1 01/02/2024 1118    BASOSABS 0.1 01/02/2024 1118      Recent Labs (within last 365 days)      Recent Labs    12/14/23 1540 01/02/24 1118  HGB 15.8* 15.6*        CMP     Labs (Brief)          Component Value Date/Time    NA 140 12/14/2023 1540    NA 141 11/26/2020 1044    K 3.9 12/14/2023 1540    CL 104 12/14/2023 1540    CO2 27 12/14/2023 1540    GLUCOSE 97 12/14/2023 1540    BUN 13 12/14/2023 1540    BUN 9 11/26/2020 1044    CREATININE 0.83 12/14/2023 1540    CALCIUM  9.5 12/14/2023 1540    PROT 7.5 12/14/2023 1540    ALBUMIN 4.6 12/14/2023 1540    AST 14 12/14/2023 1540    ALT 16 12/14/2023 1540    ALKPHOS 98 12/14/2023 1540    BILITOT 0.5 12/14/2023 1540          Latest Ref Rng & Units 12/14/2023    3:40 PM  Hepatic Function  Total Protein 6.0 - 8.3 g/dL 7.5   Albumin 3.5 - 5.2 g/dL 4.6   AST 0 - 37 U/L 14   ALT 0 - 35 U/L 16   Alk Phosphatase 39 - 117 U/L 98   Total Bilirubin 0.2 - 1.2 mg/dL 0.5       Current Medications:    Current Outpatient Medications (Endocrine & Metabolic):    levothyroxine (SYNTHROID) 88 MCG tablet, Take 88 mcg by mouth every morning.   Current Outpatient Medications (Cardiovascular):    amLODipine (NORVASC) 2.5 MG tablet, Take 2.5 mg by mouth daily.   losartan (COZAAR) 50 MG tablet, Take 50 mg by mouth 2 (two) times daily.   nitroGLYCERIN  (NITROSTAT ) 0.4 MG SL tablet, Place 0.4 mg under the tongue every 5 (five) minutes as needed for chest pain.         Current Outpatient Medications (Other):     LORazepam (ATIVAN) 0.5 MG tablet, Take 0.25 mg by mouth every 8 (eight) hours as needed for anxiety.   Multiple Vitamin (MULTIVITAMIN) tablet, Take 1 tablet by mouth daily.   pantoprazole  (PROTONIX ) 40 MG tablet, Take 1 tablet (40 mg total) by mouth 2 (two) times daily before a meal.   polycarbophil (FIBERCON) 625 MG tablet, Take 625 mg by mouth daily.   VITAMIN D PO, Take by mouth. Most days   Medical History:      Past Medical History:  Diagnosis Date   Acquired hypothyroidism 10/30/2015    Last Assessment & Plan:  Formatting of this note might  be different from the original. Relevant Hx: Course: Daily Update: Today's Plan:updated TSH today on her current dose of med  Electronically signed by: Eleanor Merlynn Lady, NP 11/02/15 1144 Formatting of this note might be different from the original. Last Assessment & Plan:  Relevant Hx: Course: Daily Update: Today's Plan:updated TSH t   Adnexal mass 11/21/2018   Angina pectoris (HCC) 11/12/2020   Anxiety 11/06/2016   Aortic atherosclerosis (HCC) 02/14/2021   Arthritis     Breast pain 03/22/2022   Chest pain in adult 05/30/2016   Confusion 03/30/2016    Formatting of this note might be different from the original. Last Assessment & Plan:  She needs to have MRI of her brain set up and will need CMP and lipids , updated TSH and CBC but will do labs at the hospital when MRI done, set up carotid dopplers as well   Costochondritis 01/12/2016    Formatting of this note might be different from the original. Last Assessment & Plan:  Relevant Hx: Course: Daily Update: Today's Plan:we discussed this in depth today and she is going to use heat for her discomfort and she is going to avoid the NSAIDS at this time which typically bother her and suspect her putting out mulch was the problem in the fact she was carrying bales of it bilaterally and    Degeneration of lumbosacral intervertebral disc 10/30/2015    Last Assessment & Plan:  Formatting of this  note might be different from the original. Relevant Hx: Course: Daily Update: Today's Plan:this is stable for her at this time work on diet and exercise  Electronically signed by: Eleanor Merlynn Lady, NP 11/02/15 1145 Formatting of this note might be different from the original. Last Assessment & Plan:  Relevant Hx: Course: Daily Update: Today's P   Diverticulitis of large intestine without perforation or abscess with bleeding 01/20/2016    Formatting of this note might be different from the original. Last Assessment & Plan:  Relevant Hx: Course: Daily Update: Today's Plan:she is going to have CBC and CMP drawn as well as she is going to have oral abx sent in for this and discussed with her if her S/S worsen or if she is having abnormal labs then we will have her seen by GI sooner than her JUne appt she was able to get herself with D   Diverticulosis 01/20/2016    Last Assessment & Plan:  Formatting of this note might be different from the original. Relevant Hx: Course: Daily Update: Today's Plan:she is going to have CBC and CMP drawn as well as she is going to have oral abx sent in for this and discussed with her if her S/S worsen or if she is having abnormal labs then we will have her seen by GI sooner than her JUne appt she was able to get herself with D   Gastroesophageal reflux disease without esophagitis 10/30/2015    Last Assessment & Plan:  Formatting of this note might be different from the original. Relevant Hx: Course: Daily Update: Today's Plan:this is stable for her on the dexilant  Electronically signed by: Eleanor Merlynn Lady, NP 11/02/15 1144 Formatting of this note might be different from the original. Last Assessment & Plan:  Relevant Hx: Course: Daily Update: Today's Plan:this is stable for    History of anemia 03/25/2017   History of colon polyps     Hypertension     IBS (irritable bowel syndrome)     Low back pain  without sciatica 11/02/2015    Formatting of this note  might be different from the original. Last Assessment & Plan:  Relevant Hx: Course: Daily Update: Today's Plan:update her UA here today to ensure that there is not anything wrong with her intermittent back pain  Electronically signed by: Eleanor Merlynn Lady, NP 11/02/15 1149   Obesity (BMI 30.0-34.9) 11/12/2020   Osteopenia 10/30/2015    Last Assessment & Plan:  Formatting of this note might be different from the original. Relevant Hx: Course: Daily Update: Today's Plan:she is working on taking her calcium  and vitamin daily  Electronically signed by: Eleanor Merlynn Lady, NP 11/02/15 1146 Formatting of this note might be different from the original. Last Assessment & Plan:  Relevant Hx: Course: Daily Update: Today's Plan:she   Osteoporosis     Pure hypercholesterolemia 10/30/2015    Last Assessment & Plan:  Formatting of this note might be different from the original. Reviewed with her that her last lipids were elevated and that is weight mediated as she has gained about 30 pounds back that she had lost doing her gluten free diet which she is no longer doing. Formatting of this note might be different from the original. Last Assessment & Plan:  Reviewed with her that her last   Refusal of statin medication by patient 10/18/2020   Vitamin D deficiency disease 10/30/2015    Last Assessment & Plan:  Formatting of this note might be different from the original. Relevant Hx: Course: Daily Update: Today's Plan:discussed with her today and she is to have her level checked and she is going to then have her med sent in  Electronically signed by: Eleanor Merlynn Lady, NP 11/02/15 1148 Formatting of this note might be different from the original. Last Assessment & Plan:        Allergies:  Allergies       Allergies  Allergen Reactions   Carafate [Sucralfate] Diarrhea   Rosuvastatin  Other (See Comments)      myalgias   Nsaids Nausea And Vomiting   Pravastatin Sodium Nausea And Vomiting    Zetia [Ezetimibe] Other (See Comments)      Myalgias (intolerance) Joint stiffness   Codeine Nausea And Vomiting and Nausea Only        Surgical History:  She  has a past surgical history that includes Oophorectomy; Vaginal hysterectomy; Rhinoplasty; Toe Surgery; Esophagogastroduodenoscopy (06/06/2017); and Colonoscopy (03/08/2016). Family History:  Her family history includes Colon cancer in her cousin; Hyperlipidemia in her mother; Hypertension in her father and mother; Stomach cancer in her paternal grandmother.   REVIEW OF SYSTEMS  : All other systems reviewed and negative except where noted in the History of Present Illness.   PHYSICAL EXAM: BP 108/70 (BP Location: Left Arm, Patient Position: Sitting, Cuff Size: Large)   Pulse 72   Ht 5' 0.75 (1.543 m) Comment: height measured without shoes  Wt 178 lb (80.7 kg)   BMI 33.91 kg/m  Physical Exam   GENERAL APPEARANCE: Well nourished, in no apparent distress. HEENT: No cervical lymphadenopathy, unremarkable thyroid, sclerae anicteric, conjunctiva pink. RESPIRATORY: Respiratory effort normal, breath sounds equal bilaterally without rales, rhonchi, or wheezing. CARDIO: Regular rate and rhythm with no murmurs, rubs, or gallops, peripheral pulses intact. ABDOMEN: Soft, non-distended, active bowel sounds in all four quadrants, tenderness in lower region, no rebound, no mass appreciated. RECTAL: Declines. MUSCULOSKELETAL: Full range of motion, normal gait, without edema. SKIN: Dry, intact without rashes or lesions. No jaundice. NEURO: Alert, oriented, no focal deficits. PSYCH:  Cooperative, normal mood and affect.       Alan JONELLE Coombs, PA-C    Attending physician's note   I have taken history, reviewed the chart and examined the patient. I performed a substantive portion of this encounter, including complete performance of at least one of the key components, in conjunction with the APP. I agree with the Advanced Practitioner's  note, impression and recommendations.    History of Present Illness Adreona Brand is a 73 year old female with diverticulosis who presents with persistent lower abdominal pain.  She experiences persistent lower abdominal pain, described as constant and sometimes waking her at night. The pain does not improve with bowel movements or eating but is somewhat relieved by lying down. Physical activity does not alter the pain.  A CT scan on May 1 was performed; the patient recalls being told she has diverticulosis and that there was stool in her colon. She underwent a colonoscopy in 2023 and recalls being told she has multiple diverticuli. No blood in her stool.  She experiences constipation and was previously prescribed Linzess , which caused diarrhea for four days, leading to discontinuation. She occasionally uses Miralax but not daily, as she sometimes has multiple bowel movements in a day. Her bowel movements are described as 'skinny', and she experiences significant gas. Her bowel movements do not always feel fully evacuated.  She has a history of back pain, which occurs intermittently depending on her activities. She also reports occasional chest pain, which has improved. She takes pantoprazole  once daily, occasionally twice if needed for breakthrough symptoms.  No urinary problems or burning with urination.  Today's Vitals   08/15/24 1040  BP: (!) 150/80  Pulse: 74  Weight: 176 lb 8 oz (80.1 kg)  Height: 5' 1.5 (1.562 m)   Body mass index is 32.81 kg/m.   Gen: awake, alert, NAD HEENT: anicteric, no pallor CV: RRR, no mrg Pulm: CTA b/l Abd: soft, NT/ND, +BS throughout Ext: no c/c/e Neuro: nonfocal   GI WU: CT AP with contrast: 01/03/2024: No acute abnormalities.  Moderate distal colonic diverticulosis, mild hepatic steatosis EGD 02/2024: Neg, presbyesophagus, gastritis, duodenal diverticulum.  Neg Bx for HP.  Esophageal biopsies consistent with reflux.  No  EoE. Colonoscopy 09/2021: neg except for pancolonic diverticulosis.  No need to repeat.     IMP: B/L lower abdo pain- ?etiology. ?musculoskeltal, ?painful divticular disease. Neg CT AP 01/2024 Chronic constipation UGI symptoms - resolved  Plan: -Heating pads -Bentyl 10mg  po BID #60, 4RF -Continue MiraLAX as needed. -CBC, CMP, UA -CT reviewed -FU in 12 weeks.  If still with problems, repeat CT Abdo/pelvis with contrast   Anselm Bring, MD [facility] [phone]

## 2024-08-15 NOTE — Patient Instructions (Addendum)
 _______________________________________________________  If your blood pressure at your visit was 140/90 or greater, please contact your primary care physician to follow up on this.  _______________________________________________________  If you are age 74 or older, your body mass index should be between 23-30. Your Body mass index is 32.81 kg/m. If this is out of the aforementioned range listed, please consider follow up with your Primary Care Provider.  If you are age 7 or younger, your body mass index should be between 19-25. Your Body mass index is 32.81 kg/m. If this is out of the aformentioned range listed, please consider follow up with your Primary Care Provider.   ________________________________________________________  The [facility] providers would like to encourage you to use MYCHART to communicate with providers for non-urgent requests or questions.  Due to long hold times on the telephone, sending your provider a message by [facility] may be a faster and more efficient way to get a response.  Please allow 48 business hours for a response.  Please remember that this is for non-urgent requests.  _______________________________________________________  [facility] is using a team-based approach to care.  Your team is made up of your doctor and two to three APPS. Our APPS (Nurse Practitioners and Physician Assistants) work with your physician to ensure care continuity for you. They are fully qualified to address your health concerns and develop a treatment plan. They communicate directly with your gastroenterologist to care for you. Seeing the Advanced Practice Practitioners on your physician's team can help you by facilitating care more promptly, often allowing for earlier appointments, access to diagnostic testing, procedures, and other specialty referrals.   Your provider has requested that you go to the basement level for lab work before leaving today. Press B on the  elevator. The lab is located at the first door on the left as you exit the elevator.  Please follow up in 3 months. Give us  a call at [phone] to schedule an appointment.  We have sent the following medications to your pharmacy for you to pick up at your convenience: Bentyl 10mg  2 times a day  Thank you,  Dr. Lynnie Bring

## 2024-08-16 ENCOUNTER — Ambulatory Visit: Payer: Self-pay | Admitting: Gastroenterology

## 2024-08-18 ENCOUNTER — Other Ambulatory Visit: Payer: Self-pay

## 2024-08-18 DIAGNOSIS — N390 Urinary tract infection, site not specified: Secondary | ICD-10-CM

## 2024-08-18 MED ORDER — LEVOFLOXACIN 500 MG PO TABS: 500.0000 mg | ORAL_TABLET | Freq: Every day | ORAL | 0 refills | Status: AC

## 2024-09-08 ENCOUNTER — Telehealth: Payer: Self-pay

## 2024-09-08 ENCOUNTER — Telehealth: Payer: Self-pay | Admitting: Gastroenterology

## 2024-09-08 NOTE — Telephone Encounter (Signed)
"  Noted thanks     "

## 2024-09-08 NOTE — Telephone Encounter (Signed)
 Inbound call from patient stating that she is not able to make her appointment on March the 12 th. Patient was reschedule for March the 17 th per her request. Please advise.

## 2024-09-08 NOTE — Telephone Encounter (Signed)
-----   Message from [facility] [place] [facility] sent at 08/15/2024 11:43 AM EST ----- Regarding: Appt in March Schedule patient in March for an appointment with Dr Charlanne

## 2024-09-08 NOTE — Telephone Encounter (Signed)
 LVM and scheduled for 11-13-24 at 1120am for patient. Mychart message sent as well

## 2024-09-08 NOTE — Telephone Encounter (Signed)
 Attempted to reach patient to discuss her Linzess  and check if she had enough to carry her until OV in March.  Will send a [facility] message.

## 2024-09-08 NOTE — Telephone Encounter (Signed)
 Inbound call from patient stating that she was advised in her last appointment to call our office and inform us  on how she is doing since being on Linzess . Patient stated that she is feeling much better and only has occasional abdominal pain but nothing severe. Please advise.

## 2024-09-09 NOTE — Telephone Encounter (Signed)
 Spoke with pt. Pt stated that she is not taking the Linzess  and only took it for four days due to it causing diarrhea. Pt stated that she wanted Dr. Charlanne to be aware that she is taking the dicyclomine  as prescribed and it is helping. Pt stated that she is having daily BM's.  Routed as FYI

## 2024-09-09 NOTE — Telephone Encounter (Signed)
"  Left message for pt to call back   "

## 2024-09-09 NOTE — Telephone Encounter (Signed)
 Patient returning phone call. Please advise, thank you

## 2024-09-11 ENCOUNTER — Other Ambulatory Visit: Payer: Self-pay | Admitting: Physician Assistant

## 2024-11-13 ENCOUNTER — Ambulatory Visit: Admitting: Gastroenterology

## 2024-11-18 ENCOUNTER — Ambulatory Visit: Admitting: Gastroenterology
